# Patient Record
Sex: Female | Born: 1950 | Race: White | Hispanic: No | Smoking: Never smoker
Health system: Southern US, Community
[De-identification: ages and names within clinical notes are randomized; demographics above are authoritative.]

## PROBLEM LIST (undated history)

## (undated) DIAGNOSIS — M8580 Other specified disorders of bone density and structure, unspecified site: Secondary | ICD-10-CM

## (undated) DIAGNOSIS — I671 Cerebral aneurysm, nonruptured: Secondary | ICD-10-CM

## (undated) DIAGNOSIS — T7840XA Allergy, unspecified, initial encounter: Secondary | ICD-10-CM

## (undated) DIAGNOSIS — K219 Gastro-esophageal reflux disease without esophagitis: Secondary | ICD-10-CM

## (undated) HISTORY — DX: Allergy, unspecified, initial encounter: T78.40XA

## (undated) HISTORY — PX: TONSILLECTOMY: SUR1361

## (undated) HISTORY — DX: Other specified disorders of bone density and structure, unspecified site: M85.80

## (undated) HISTORY — DX: Cerebral aneurysm, nonruptured: I67.1

## (undated) HISTORY — DX: Gastro-esophageal reflux disease without esophagitis: K21.9

## (undated) HISTORY — PX: TUBAL LIGATION: SHX77

---

## 2003-12-04 ENCOUNTER — Ambulatory Visit: Payer: Self-pay | Admitting: Family Medicine

## 2003-12-15 ENCOUNTER — Ambulatory Visit: Payer: Self-pay | Admitting: Family Medicine

## 2004-12-06 ENCOUNTER — Ambulatory Visit: Payer: Self-pay | Admitting: Family Medicine

## 2005-03-11 ENCOUNTER — Ambulatory Visit: Payer: Self-pay | Admitting: Unknown Physician Specialty

## 2005-12-08 ENCOUNTER — Ambulatory Visit: Payer: Self-pay | Admitting: Family Medicine

## 2005-12-14 ENCOUNTER — Ambulatory Visit: Payer: Self-pay | Admitting: Family Medicine

## 2007-07-26 ENCOUNTER — Ambulatory Visit: Payer: Self-pay | Admitting: Family Medicine

## 2008-10-20 ENCOUNTER — Ambulatory Visit: Payer: Self-pay | Admitting: Family Medicine

## 2009-11-19 ENCOUNTER — Ambulatory Visit: Payer: Self-pay | Admitting: Family Medicine

## 2010-12-24 ENCOUNTER — Ambulatory Visit: Payer: Self-pay | Admitting: Family Medicine

## 2011-12-09 ENCOUNTER — Ambulatory Visit: Payer: Self-pay | Admitting: Family Medicine

## 2011-12-14 DIAGNOSIS — R0689 Other abnormalities of breathing: Secondary | ICD-10-CM | POA: Insufficient documentation

## 2011-12-14 DIAGNOSIS — R062 Wheezing: Secondary | ICD-10-CM | POA: Insufficient documentation

## 2012-01-10 ENCOUNTER — Ambulatory Visit: Payer: Self-pay | Admitting: Family Medicine

## 2012-05-30 ENCOUNTER — Ambulatory Visit: Payer: Self-pay | Admitting: Unknown Physician Specialty

## 2012-06-14 DIAGNOSIS — H93A9 Pulsatile tinnitus, unspecified ear: Secondary | ICD-10-CM | POA: Insufficient documentation

## 2012-06-14 DIAGNOSIS — I671 Cerebral aneurysm, nonruptured: Secondary | ICD-10-CM | POA: Insufficient documentation

## 2013-02-12 DIAGNOSIS — E7800 Pure hypercholesterolemia, unspecified: Secondary | ICD-10-CM | POA: Insufficient documentation

## 2013-02-14 ENCOUNTER — Ambulatory Visit: Payer: Self-pay | Admitting: Family Medicine

## 2013-05-06 ENCOUNTER — Ambulatory Visit: Payer: Self-pay | Admitting: Unknown Physician Specialty

## 2014-04-16 ENCOUNTER — Ambulatory Visit: Payer: Self-pay | Admitting: Obstetrics and Gynecology

## 2014-05-28 ENCOUNTER — Ambulatory Visit
Admit: 2014-05-28 | Disposition: A | Discharge status: Home / Self Care | Payer: Self-pay | Attending: Family Medicine | Admitting: Family Medicine

## 2014-07-10 ENCOUNTER — Other Ambulatory Visit: Payer: Self-pay | Admitting: Neurosurgery

## 2014-07-10 DIAGNOSIS — I7774 Dissection of vertebral artery: Secondary | ICD-10-CM

## 2014-07-11 ENCOUNTER — Other Ambulatory Visit: Payer: Self-pay | Admitting: Neurosurgery

## 2014-07-14 ENCOUNTER — Other Ambulatory Visit: Payer: Self-pay | Admitting: Neurosurgery

## 2014-07-14 DIAGNOSIS — I7774 Dissection of vertebral artery: Secondary | ICD-10-CM

## 2014-07-21 ENCOUNTER — Inpatient Hospital Stay: Admission: RE | Admit: 2014-07-21 | Payer: Self-pay | Source: Ambulatory Visit

## 2014-07-24 ENCOUNTER — Other Ambulatory Visit: Payer: Self-pay

## 2014-07-24 ENCOUNTER — Ambulatory Visit
Admission: RE | Admit: 2014-07-24 | Discharge: 2014-07-24 | Disposition: A | Discharge status: Home / Self Care | Payer: 59 | Source: Ambulatory Visit | Attending: Neurosurgery | Admitting: Neurosurgery

## 2014-07-24 DIAGNOSIS — I7774 Dissection of vertebral artery: Secondary | ICD-10-CM | POA: Diagnosis present

## 2014-07-24 DIAGNOSIS — I728 Aneurysm of other specified arteries: Secondary | ICD-10-CM | POA: Diagnosis not present

## 2014-07-24 DIAGNOSIS — I998 Other disorder of circulatory system: Secondary | ICD-10-CM | POA: Diagnosis not present

## 2014-07-24 MED ORDER — IOHEXOL 350 MG/ML SOLN
75.0000 mL | Freq: Once | INTRAVENOUS | Status: AC | PRN
  Administered 2014-07-24: 75 mL via INTRAVENOUS

## 2015-01-13 ENCOUNTER — Encounter: Payer: Self-pay | Admitting: Family Medicine

## 2015-01-13 ENCOUNTER — Ambulatory Visit (INDEPENDENT_AMBULATORY_CARE_PROVIDER_SITE_OTHER): Payer: 59 | Admitting: Family Medicine

## 2015-01-13 VITALS — BP 112/78 | HR 112 | Temp 98.9°F | Resp 16 | Ht 64.0 in | Wt 169.1 lb

## 2015-01-13 DIAGNOSIS — F339 Major depressive disorder, recurrent, unspecified: Secondary | ICD-10-CM | POA: Diagnosis not present

## 2015-01-13 DIAGNOSIS — K5900 Constipation, unspecified: Secondary | ICD-10-CM | POA: Insufficient documentation

## 2015-01-13 DIAGNOSIS — K219 Gastro-esophageal reflux disease without esophagitis: Secondary | ICD-10-CM | POA: Insufficient documentation

## 2015-01-13 DIAGNOSIS — T7840XA Allergy, unspecified, initial encounter: Secondary | ICD-10-CM | POA: Insufficient documentation

## 2015-01-13 MED ORDER — SERTRALINE HCL 25 MG PO TABS: 25.0000 mg | ORAL_TABLET | Freq: Every day | ORAL | Status: DC

## 2015-01-13 NOTE — Progress Notes (Signed)
Name: Laura Harper   MRN: [id number]    DOB: 08-08-1950   Date:01/13/2015       Progress Note  Subjective  Chief Complaint  Chief Complaint  Patient presents with  . Stress    patient stated that she has been crying a lot and haslost her temper at times.    HPI  Laura Harper is a 64 year old female here today with reports of mood changes. Onset just before Thanksgiving early November. Husband has cancer, he is stable, but condition is difficult as his memory is bad and hard time remembering things explained to him. 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 Daughter moved to [place] state with granddaughter in March. All these factors contributed to mood changes. Denies SI or HI.   Many years ago had depression, placed on Prozac dose unknown, did not help much, made her have bad short term memory. Did not know how she got to work, how she did her job. Very foggy memory while on medication.   Daughter and biological sister do well on Zoloft.   Past Medical History  Diagnosis Date  . Allergy   . GERD (gastroesophageal reflux disease)     Patient Active Problem List   Diagnosis Date Noted  . Allergic state 01/13/2015  . CN (constipation) 01/13/2015  . Acid reflux 01/13/2015  . Pure hypercholesterolemia 02/12/2013  . Aneurysm, cerebral, nonruptured 06/14/2012  . Tinnitus of vascular origin 06/14/2012  . Asthmatic breathing 12/14/2011    Social History  Substance Use Topics  . Smoking status: Never Smoker   . Smokeless tobacco: Not on file  . Alcohol Use: No     Current outpatient prescriptions:  .  aspirin 81 MG tablet, Take 81 mg by mouth daily., Disp: , Rfl:  .  omeprazole (PRILOSEC) 10 MG capsule, Take by mouth., Disp: , Rfl:   Past Surgical History  Procedure Laterality Date  . Tubal ligation    . Tonsillectomy Bilateral     Family History  Problem Relation Age of Onset  . Anxiety disorder Mother   . Mood Disorder Father   . Depression Sister   .  Depression Daughter     No Known Allergies   Review of Systems  CONSTITUTIONAL: No significant weight changes, fever, chills, weakness or fatigue.  CARDIOVASCULAR: No chest pain, chest pressure or chest discomfort. No palpitations or edema.  RESPIRATORY: No shortness of breath, cough or sputum.  NEUROLOGICAL: No headache, dizziness, syncope, paralysis, ataxia, numbness or tingling in the extremities. No memory changes. No change in bowel or bladder control.  PSYCHIATRIC: Yes change in mood. No change in sleep pattern.  ENDOCRINOLOGIC: No reports of sweating, cold or heat intolerance. No polyuria or polydipsia.   Depression screen PHQ 2/9 01/13/2015  Down, Depressed, Hopeless 3  PHQ - 2 Score 3  Altered sleeping 3  Tired, decreased energy 3  Change in appetite 0  Feeling bad or failure about yourself  1  Trouble concentrating 1  Moving slowly or fidgety/restless 0  Suicidal thoughts 0  PHQ-9 Score 11     Objective  BP 112/78 mmHg  Pulse 112  Temp(Src) 98.9 F (37.2 C) (Oral)  Resp 16  Ht 5\' 4"  (1.626 m)  Wt 169 lb 1.6 oz (76.703 kg)  BMI 29.01 kg/m2  SpO2 96% Body mass index is 29.01 kg/(m^2).  Physical Exam  Constitutional: Patient appears well-developed and well-nourished. In no distress.  Neck: Normal range of motion. Neck supple.  No JVD present. No thyromegaly present.  Cardiovascular: Normal rate, regular rhythm and normal heart sounds.  No murmur heard.  Pulmonary/Chest: Effort normal and breath sounds normal. No respiratory distress. Psychiatric: Patient has a sad mood and affect. Behavior is normal in office today. Judgment and thought content normal in office today.   Assessment & Plan   1. Major depressive disorder, recurrent, with melancholic features ([place]) Decided on Zoloft trial as other female family members have done well on this medication. The patient has been counseled on the proper use, side effects and potential interactions of the new  medication. Patient encouraged to review the side effects and safety profile pamphlet provided with the prescription from the pharmacy as well as request counseling from the pharmacy team as needed.   - sertraline (ZOLOFT) 25 MG tablet; Take 1 tablet (25 mg total) by mouth daily.  Dispense: 30 tablet; Refill: 1

## 2015-03-09 ENCOUNTER — Telehealth: Payer: Self-pay | Admitting: Family Medicine

## 2015-03-09 ENCOUNTER — Other Ambulatory Visit: Payer: Self-pay

## 2015-03-09 DIAGNOSIS — F339 Major depressive disorder, recurrent, unspecified: Secondary | ICD-10-CM

## 2015-03-09 NOTE — Telephone Encounter (Signed)
Pt has an appt 04/13/2015 and she is requesting a refill on Zoloft. Pt requesting enough to last until her appt. [facility] td.

## 2015-03-11 MED ORDER — SERTRALINE HCL 25 MG PO TABS: 25.0000 mg | ORAL_TABLET | Freq: Every day | ORAL | Status: DC

## 2015-03-17 ENCOUNTER — Other Ambulatory Visit: Payer: Self-pay

## 2015-03-17 DIAGNOSIS — F339 Major depressive disorder, recurrent, unspecified: Secondary | ICD-10-CM

## 2015-03-17 MED ORDER — SERTRALINE HCL 25 MG PO TABS: 25.0000 mg | ORAL_TABLET | Freq: Every day | ORAL | Status: DC

## 2015-03-23 ENCOUNTER — Encounter: Payer: Self-pay | Admitting: Anesthesiology

## 2015-03-23 ENCOUNTER — Ambulatory Visit
Admission: RE | Admit: 2015-03-23 | Discharge: 2015-03-23 | Disposition: A | Discharge status: Home / Self Care | Payer: 59 | Source: Ambulatory Visit | Attending: Unknown Physician Specialty | Admitting: Unknown Physician Specialty

## 2015-03-23 ENCOUNTER — Encounter
Admission: RE | Disposition: A | Discharge status: Home / Self Care | Payer: Self-pay | Source: Ambulatory Visit | Attending: Unknown Physician Specialty

## 2015-03-23 ENCOUNTER — Ambulatory Visit: Payer: 59 | Admitting: Certified Registered Nurse Anesthetist

## 2015-03-23 DIAGNOSIS — D122 Benign neoplasm of ascending colon: Secondary | ICD-10-CM | POA: Diagnosis not present

## 2015-03-23 DIAGNOSIS — Z1211 Encounter for screening for malignant neoplasm of colon: Secondary | ICD-10-CM | POA: Insufficient documentation

## 2015-03-23 DIAGNOSIS — Z7982 Long term (current) use of aspirin: Secondary | ICD-10-CM | POA: Insufficient documentation

## 2015-03-23 DIAGNOSIS — K621 Rectal polyp: Secondary | ICD-10-CM | POA: Insufficient documentation

## 2015-03-23 DIAGNOSIS — D125 Benign neoplasm of sigmoid colon: Secondary | ICD-10-CM | POA: Diagnosis not present

## 2015-03-23 DIAGNOSIS — K640 First degree hemorrhoids: Secondary | ICD-10-CM | POA: Insufficient documentation

## 2015-03-23 DIAGNOSIS — D123 Benign neoplasm of transverse colon: Secondary | ICD-10-CM | POA: Diagnosis not present

## 2015-03-23 DIAGNOSIS — D126 Benign neoplasm of colon, unspecified: Secondary | ICD-10-CM

## 2015-03-23 DIAGNOSIS — I739 Peripheral vascular disease, unspecified: Secondary | ICD-10-CM | POA: Diagnosis not present

## 2015-03-23 DIAGNOSIS — F329 Major depressive disorder, single episode, unspecified: Secondary | ICD-10-CM | POA: Insufficient documentation

## 2015-03-23 DIAGNOSIS — K219 Gastro-esophageal reflux disease without esophagitis: Secondary | ICD-10-CM | POA: Insufficient documentation

## 2015-03-23 DIAGNOSIS — Z79899 Other long term (current) drug therapy: Secondary | ICD-10-CM | POA: Diagnosis not present

## 2015-03-23 HISTORY — PX: COLONOSCOPY WITH PROPOFOL: SHX5780

## 2015-03-23 SURGERY — COLONOSCOPY WITH PROPOFOL
Anesthesia: General

## 2015-03-23 MED ORDER — SODIUM CHLORIDE 0.9 % IV SOLN
INTRAVENOUS | Status: DC
  Administered 2015-03-23: 08:00:00 via INTRAVENOUS

## 2015-03-23 MED ORDER — PROPOFOL 10 MG/ML IV BOLUS
INTRAVENOUS | Status: DC | PRN
  Administered 2015-03-23: 30 mg via INTRAVENOUS
  Administered 2015-03-23 (×2): 20 mg via INTRAVENOUS
  Administered 2015-03-23: 10 mg via INTRAVENOUS

## 2015-03-23 MED ORDER — LIDOCAINE HCL (CARDIAC) 20 MG/ML IV SOLN
INTRAVENOUS | Status: DC | PRN
  Administered 2015-03-23: 60 mg via INTRAVENOUS

## 2015-03-23 MED ORDER — SODIUM CHLORIDE 0.9 % IV SOLN: INTRAVENOUS | Status: DC

## 2015-03-23 MED ORDER — PROPOFOL 500 MG/50ML IV EMUL
INTRAVENOUS | Status: DC | PRN
  Administered 2015-03-23: 140 ug/kg/min via INTRAVENOUS

## 2015-03-23 MED ORDER — MIDAZOLAM HCL 2 MG/2ML IJ SOLN
INTRAMUSCULAR | Status: DC | PRN
  Administered 2015-03-23: 1 mg via INTRAVENOUS

## 2015-03-23 NOTE — Transfer of Care (Signed)
Immediate Anesthesia Transfer of Care Note  Patient: Laura Harper  Procedure(s) Performed: Procedure(s): COLONOSCOPY WITH PROPOFOL (N/A)  Patient Location: PACU  Anesthesia Type:General  Level of Consciousness: awake  Airway & Oxygen Therapy: Patient Spontanous Breathing and Patient connected to nasal cannula oxygen  Post-op Assessment: Report given to RN and Post -op Vital signs reviewed and stable  Post vital signs: Reviewed and stable  Last Vitals:  Filed Vitals:   03/23/15 0750  BP: 143/70  Pulse: 60  Temp: 36.7 C  Resp: 17    Complications: No apparent anesthesia complications

## 2015-03-23 NOTE — Anesthesia Postprocedure Evaluation (Signed)
Anesthesia Post Note  Patient: Laura Harper  Procedure(s) Performed: Procedure(s) (LRB): COLONOSCOPY WITH PROPOFOL (N/A)  Patient location during evaluation: Endoscopy Anesthesia Type: General Level of consciousness: awake and alert Pain management: pain level controlled Vital Signs Assessment: post-procedure vital signs reviewed and stable Respiratory status: spontaneous breathing, nonlabored ventilation, respiratory function stable and patient connected to nasal cannula oxygen Cardiovascular status: blood pressure returned to baseline and stable Postop Assessment: no signs of nausea or vomiting Anesthetic complications: no    Last Vitals:  Filed Vitals:   03/23/15 0911 03/23/15 0921  BP: 114/70 125/64  Pulse: 56 55  Temp:    Resp: 13 17    Last Pain: There were no vitals filed for this visit.               Precious Haws Helayne Metsker

## 2015-03-23 NOTE — Anesthesia Preprocedure Evaluation (Signed)
Anesthesia Evaluation  Patient identified by MRN, date of birth, ID band Patient awake    Reviewed: Allergy & Precautions, H&P , NPO status , Patient's Chart, lab work & pertinent test results  History of Anesthesia Complications Negative for: history of anesthetic complications  Airway Mallampati: III  TM Distance: >3 FB Neck ROM: full    Dental  (+) Poor Dentition   Pulmonary neg pulmonary ROS, neg shortness of breath,    Pulmonary exam normal breath sounds clear to auscultation       Cardiovascular Exercise Tolerance: Good (-) angina+ Peripheral Vascular Disease  (-) Past MI and (-) DOE negative cardio ROS Normal cardiovascular exam Rhythm:regular Rate:Normal     Neuro/Psych PSYCHIATRIC DISORDERS Depression negative neurological ROS     GI/Hepatic Neg liver ROS, GERD  Controlled,  Endo/Other  negative endocrine ROS  Renal/GU negative Renal ROS  negative genitourinary   Musculoskeletal   Abdominal   Peds  Hematology negative hematology ROS (+)   Anesthesia Other Findings Past Medical History:   Allergy                                                      GERD (gastroesophageal reflux disease)                       Brain aneurysm                                              Past Surgical History:   TUBAL LIGATION                                                TONSILLECTOMY                                   Bilateral             BMI    Body Mass Index   30.71 kg/m 2    Signs and symptoms suggestive of sleep apnea    Reproductive/Obstetrics negative OB ROS                             Anesthesia Physical Anesthesia Plan  ASA: III  Anesthesia Plan: General   Post-op Pain Management:    Induction:   Airway Management Planned:   Additional Equipment:   Intra-op Plan:   Post-operative Plan:   Informed Consent: I have reviewed the patients History and Physical, chart,  labs and discussed the procedure including the risks, benefits and alternatives for the proposed anesthesia with the patient or authorized representative who has indicated his/her understanding and acceptance.   Dental Advisory Given  Plan Discussed with: Anesthesiologist, CRNA and Surgeon  Anesthesia Plan Comments:         Anesthesia Quick Evaluation

## 2015-03-23 NOTE — Op Note (Signed)
[facility] Patient Name: Laura Harper Procedure Date: 03/23/2015 8:06 AM MRN: [id number] Account #: [id number] Date of Birth: 06-Mar-1950 Admit Type: Outpatient Age: 65 Room: [facility] ENDO ROOM 1 Gender: Female Note Status: Finalized Procedure:            Colonoscopy Indications:          Screening for colorectal malignant neoplasm Providers:            Manya Silvas, MD Referring MD:         Bobetta Lime (Referring MD) Medicines:            Propofol per Anesthesia Complications:        No immediate complications. Procedure:            Pre-Anesthesia Assessment:                       - After reviewing the risks and benefits, the patient                        was deemed in satisfactory condition to undergo the                        procedure.                       After obtaining informed consent, the colonoscope was                        passed under direct vision. Throughout the procedure,                        the patient's blood pressure, pulse, and oxygen                        saturations were monitored continuously. The                        Colonoscope was introduced through the anus and                        advanced to the the cecum, identified by appendiceal                        orifice and ileocecal valve. The colonoscopy was                        performed without difficulty. The patient tolerated the                        procedure well. The quality of the bowel preparation                        was excellent. Findings:      Three sessile polyps were found in the rectum, transverse colon and       ascending colon. The polyps were diminutive in size. These polyps were       removed with a jumbo cold forceps. Resection and retrieval were complete.      A small polyp was found in the sigmoid colon. The polyp was sessile. The       polyp was removed with a cold snare. Resection  and retrieval were       complete.  Internal hemorrhoids were found during endoscopy. The hemorrhoids were       small and Grade I (internal hemorrhoids that do not prolapse). Impression:           - Three diminutive polyps in the rectum, in the                        transverse colon and in the ascending colon, removed                        with a jumbo cold forceps. Resected and retrieved.                       - One small polyp in the sigmoid colon, removed with a                        cold snare. Resected and retrieved.                       - Internal hemorrhoids. Recommendation:       - Await pathology results. Manya Silvas, MD 03/23/2015 8:50:09 AM This report has been signed electronically. Number of Addenda: 0 Note Initiated On: 03/23/2015 8:06 AM Scope Withdrawal Time: 0 hours 11 minutes 25 seconds  Total Procedure Duration: 0 hours 24 minutes 8 seconds       [facility]

## 2015-03-23 NOTE — H&P (Signed)
   Primary Care Physician:  Bobetta Lime, MD Primary Gastroenterologist:  Dr. Vira Agar  Pre-Procedure History & Physical: HPI:  Laura Harper is a 65 y.o. female is here for an colonoscopy.   Past Medical History  Diagnosis Date  . Allergy   . GERD (gastroesophageal reflux disease)   . Brain aneurysm     Past Surgical History  Procedure Laterality Date  . Tubal ligation    . Tonsillectomy Bilateral     Prior to Admission medications   Medication Sig Start Date End Date Taking? Authorizing Provider  aspirin 81 MG tablet Take 81 mg by mouth daily.    Historical Provider, MD  omeprazole (PRILOSEC) 10 MG capsule Take by mouth. 03/24/10   Historical Provider, MD  sertraline (ZOLOFT) 25 MG tablet Take 1 tablet (25 mg total) by mouth daily. 03/17/15   Bobetta Lime, MD    Allergies as of 03/18/2015  . (No Known Allergies)    Family History  Problem Relation Age of Onset  . Anxiety disorder Mother   . Cancer Mother   . Aneurysm Mother     subarachnoid hemorrhage  . Mood Disorder Father   . Coronary artery disease Father   . Depression Sister   . Depression Daughter   . Heart attack Brother   . Cancer Maternal Aunt     breast    Social History   Social History  . Marital Status: Married    Spouse Name: N/A  . Number of Children: N/A  . Years of Education: N/A   Occupational History  . Not on file.   Social History Main Topics  . Smoking status: Never Smoker   . Smokeless tobacco: Not on file  . Alcohol Use: No  . Drug Use: No  . Sexual Activity: No   Other Topics Concern  . Not on file   Social History Narrative    Review of Systems: See HPI, otherwise negative ROS  Physical Exam: BP 143/70 mmHg  Pulse 60  Temp(Src) 98 F (36.7 C) (Tympanic)  Resp 17  Ht 5\' 2"  (1.575 m)  Wt 76.204 kg (168 lb)  BMI 30.72 kg/m2  SpO2 100% General:   Alert,  pleasant and cooperative in NAD Head:  Normocephalic and atraumatic. Neck:  Supple; no masses or  thyromegaly. Lungs:  Clear throughout to auscultation.    Heart:  Regular rate and rhythm. Abdomen:  Soft, nontender and nondistended. Normal bowel sounds, without guarding, and without rebound.   Neurologic:  Alert and  oriented x4;  grossly normal neurologically.  Impression/Plan: Laura Harper is here for an colonoscopy to be performed for screening  Risks, benefits, limitations, and alternatives regarding  colonoscopy have been reviewed with the patient.  Questions have been answered.  All parties agreeable.   Gaylyn Cheers, MD  03/23/2015, 8:19 AM

## 2015-03-23 NOTE — Anesthesia Procedure Notes (Signed)
Date/Time: 03/23/2015 8:19 AM Performed by: Johnna Acosta Pre-anesthesia Checklist: Patient identified, Emergency Drugs available, Suction available, Patient being monitored and Timeout performed Patient Re-evaluated:Patient Re-evaluated prior to inductionOxygen Delivery Method: Nasal cannula

## 2015-03-24 ENCOUNTER — Encounter: Payer: Self-pay | Admitting: Unknown Physician Specialty

## 2015-03-24 DIAGNOSIS — D126 Benign neoplasm of colon, unspecified: Secondary | ICD-10-CM

## 2015-03-24 LAB — SURGICAL PATHOLOGY

## 2015-04-02 ENCOUNTER — Encounter: Payer: Self-pay | Admitting: Obstetrics and Gynecology

## 2015-04-13 ENCOUNTER — Ambulatory Visit (INDEPENDENT_AMBULATORY_CARE_PROVIDER_SITE_OTHER): Payer: 59 | Admitting: Family Medicine

## 2015-04-13 ENCOUNTER — Encounter: Payer: Self-pay | Admitting: Family Medicine

## 2015-04-13 VITALS — BP 122/68 | HR 89 | Temp 98.6°F | Resp 16 | Ht 62.0 in | Wt 169.0 lb

## 2015-04-13 DIAGNOSIS — L603 Nail dystrophy: Secondary | ICD-10-CM | POA: Diagnosis not present

## 2015-04-13 DIAGNOSIS — Z124 Encounter for screening for malignant neoplasm of cervix: Secondary | ICD-10-CM | POA: Diagnosis not present

## 2015-04-13 DIAGNOSIS — Z1231 Encounter for screening mammogram for malignant neoplasm of breast: Secondary | ICD-10-CM | POA: Diagnosis not present

## 2015-04-13 DIAGNOSIS — K219 Gastro-esophageal reflux disease without esophagitis: Secondary | ICD-10-CM | POA: Diagnosis not present

## 2015-04-13 DIAGNOSIS — F3342 Major depressive disorder, recurrent, in full remission: Secondary | ICD-10-CM | POA: Diagnosis not present

## 2015-04-13 DIAGNOSIS — Z113 Encounter for screening for infections with a predominantly sexual mode of transmission: Secondary | ICD-10-CM | POA: Insufficient documentation

## 2015-04-13 DIAGNOSIS — Z0000 Encounter for general adult medical examination without abnormal findings: Secondary | ICD-10-CM

## 2015-04-13 DIAGNOSIS — E7800 Pure hypercholesterolemia, unspecified: Secondary | ICD-10-CM

## 2015-04-13 MED ORDER — SERTRALINE HCL 25 MG PO TABS: 25.0000 mg | ORAL_TABLET | Freq: Every day | ORAL | Status: DC

## 2015-04-13 NOTE — Progress Notes (Signed)
Name: Laura Harper   MRN: [id number]    DOB: 08-20-1950   Date:04/13/2015       Progress Note  Subjective  Chief Complaint  Chief Complaint  Patient presents with  . Annual Exam    HPI  Patient is here today for a Complete Female Physical Exam:  The patient has has no unusual complaints. Overall feels healthy. Diet is well balanced. In general does not exercise regularly. Sees dentist regularly and addresses vision concerns with ophthalmologist if applicable. In regards to sexual activity the patient is not currently sexually active. Currently is not concerned about exposure to any STDs.   Having some brittle nails. Started hair skin nails OTC Vitamin. Requesting MRI of brain to surveillance known aneurysm. No new symptoms, no worsening headaches, nausea or vomiting or changes to vision.   GERD stable with OTC Nexium use. Mood disorder stable with Zoloft 25 mg one a day.  Past Medical History  Diagnosis Date  . Allergy   . GERD (gastroesophageal reflux disease)   . Brain aneurysm     Past Surgical History  Procedure Laterality Date  . Tubal ligation    . Tonsillectomy Bilateral   . Colonoscopy with propofol N/A 03/23/2015    Procedure: COLONOSCOPY WITH PROPOFOL;  Surgeon: Manya Silvas, MD;  Location: [facility] ENDOSCOPY;  Service: Endoscopy;  Laterality: N/A;    Family History  Problem Relation Age of Onset  . Anxiety disorder Mother   . Cancer Mother   . Aneurysm Mother     subarachnoid hemorrhage  . Mood Disorder Father   . Coronary artery disease Father   . Depression Sister   . Depression Daughter   . Heart attack Brother   . Cancer Maternal Aunt     breast    Social History   Social History  . Marital Status: Married    Spouse Name: N/A  . Number of Children: N/A  . Years of Education: N/A   Occupational History  . Not on file.   Social History Main Topics  . Smoking status: Never Smoker   . Smokeless tobacco: Not on file  . Alcohol Use: No  .  Drug Use: No  . Sexual Activity: No   Other Topics Concern  . Not on file   Social History Narrative     Current outpatient prescriptions:  .  aspirin 81 MG tablet, Take 81 mg by mouth daily., Disp: , Rfl:  .  omeprazole (PRILOSEC) 10 MG capsule, Take by mouth., Disp: , Rfl:  .  sertraline (ZOLOFT) 25 MG tablet, Take 1 tablet (25 mg total) by mouth daily., Disp: 90 tablet, Rfl: 0  No Known Allergies  ROS  CONSTITUTIONAL: No significant weight changes, fever, chills, weakness or fatigue.  HEENT:  - Eyes: No visual changes.  - Ears: No auditory changes. No pain.  - Nose: No sneezing, congestion, runny nose. - Throat: No sore throat. No changes in swallowing. SKIN: No rash or itching.  CARDIOVASCULAR: No chest pain, chest pressure or chest discomfort. No palpitations or edema.  RESPIRATORY: No shortness of breath, cough or sputum.  GASTROINTESTINAL: No anorexia, nausea, vomiting. No changes in bowel habits. No abdominal pain or blood.  GENITOURINARY: No dysuria. No frequency. No discharge.  NEUROLOGICAL: No headache, dizziness, syncope, paralysis, ataxia, numbness or tingling in the extremities. No memory changes. No change in bowel or bladder control.  MUSCULOSKELETAL: No joint pain. No muscle pain. HEMATOLOGIC: No anemia, bleeding or bruising.  LYMPHATICS: No enlarged lymph  nodes.  PSYCHIATRIC: No change in mood. No change in sleep pattern.  ENDOCRINOLOGIC: No reports of sweating, cold or heat intolerance. No polyuria or polydipsia.   Objective  Filed Vitals:   04/13/15 1346  BP: 122/68  Pulse: 89  Temp: 98.6 F (37 C)  TempSrc: Oral  Resp: 16  Height: 5\' 2"  (1.575 m)  Weight: 169 lb (76.658 kg)  SpO2: 96%   Body mass index is 30.9 kg/(m^2).  No exam data present  Recent Results (from the past 2160 hour(s))  Surgical pathology     Status: None   Collection Time: 03/23/15  8:28 AM  Result Value Ref Range   SURGICAL PATHOLOGY      Surgical Pathology CASE:  [id number] PATIENT: Hansini Harris Surgical Pathology Report     SPECIMEN SUBMITTED: A. Colon polyp, ascending, cbx B. Colon polyp, transverse, cbx C. Colon polyp, sigmoid, cold snare D. Rectum polyp, cbx  CLINICAL HISTORY: None provided  PRE-OPERATIVE DIAGNOSIS: Screen  POST-OPERATIVE DIAGNOSIS: Polyps, internal hemorrhoids     DIAGNOSIS: A. COLON POLYP, ASCENDING; COLD BIOPSY: - TUBULAR ADENOMA. - NEGATIVE FOR HIGH-GRADE DYSPLASIA AND MALIGNANCY.  B. COLON POLYP, TRANSVERSE; COLD BIOPSY: - TUBULAR ADENOMA. - NEGATIVE FOR HIGH-GRADE DYSPLASIA AND MALIGNANCY.  C. COLON POLYP, SIGMOID; COLD SNARE: - TUBULAR ADENOMA. - NEGATIVE FOR HIGH-GRADE DYSPLASIA AND MALIGNANCY.  D. RECTUM POLYP; COLD BIOPSY: - HYPERPLASTIC POLYP. - NEGATIVE FOR DYSPLASIA AND MALIGNANCY.   GROSS DESCRIPTION:  A. Labeled: C BX polyp ascending colon  Tissue fragment(s): 4  Size: 0.2-0.3 cm  Description: pink-tan  Entirely submitted in one  cassette(s).   B. Labeled: C BX polyp transverse polyp  Tissue fragment(s): 1  Size: 0.3 cm  Description: pink  Entirely submitted in one cassette(s).   C. Labeled: cold snare polyp sigmoid colon  Tissue fragment(s): 1  Size: 1.1 x 0.3 x 0.1 cm  Description: pink-tan polypoid fragment, inked blue at the base, bisected  Entirely submitted in one cassette(s).   D. Labeled: C BX polyp rectum  Tissue fragment(s): 1  Size: 0.25 cm  Description: pink  Entirely submitted in one cassette(s).    Final Diagnosis performed by Quay Burow, MD.  Electronically signed 03/24/2015 3:13:23PM    The electronic signature indicates that the named Attending Pathologist has evaluated the specimen  Technical component performed at [facility], [address] [place], [place] [address] Lab: [phone] Dir: Darrick Penna. Evette Doffing, MD  Professional component performed at [facility], [facility], [place],  [place], [place] [address] Lab: [place] Dir: [place] Reuel Derby, MD      Physical Exam  Constitutional: Patient appears well-developed and well-nourished. In no distress.  HEENT:  - Head: Normocephalic and atraumatic.  - Ears: Bilateral TMs gray, no erythema or effusion - Nose: Nasal mucosa moist - Mouth/Throat: Oropharynx is clear and moist. No tonsillar hypertrophy or erythema. No post nasal drainage.  - Eyes: Conjunctivae clear, EOM movements normal. PERRLA. No scleral icterus.  Neck: Normal range of motion. Neck supple. No JVD present. No thyromegaly present.  Cardiovascular: Normal rate, regular rhythm and normal heart sounds.  No murmur heard.  Pulmonary/Chest: Effort normal and breath sounds normal. No respiratory distress. Abdominal: Soft. Bowel sounds are normal, no distension. There is no tenderness. no masses BREAST: Bilateral breast exam normal with no masses, skin changes or nipple discharge FEMALE GENITALIA:  External genitalia normal External urethra normal Vaginal vault normal without discharge or lesions Cervix normal without discharge or lesions Bimanual exam normal without masses RECTAL:  no rectal masses or hemorrhoids Musculoskeletal: Normal range of motion bilateral UE and LE, no joint effusions. Peripheral vascular: Bilateral LE no edema. Neurological: CN II-XII grossly intact with no focal deficits. Alert and oriented to person, place, and time. Coordination, balance, strength, speech and gait are normal.  Skin: Skin is warm and dry. No rash noted. No erythema.  Psychiatric: Patient has a normal mood and affect. Behavior is normal in office today. Judgment and thought content normal in office today.   Assessment & Plan   1. Annual physical exam Discussed in detail all recommended preventative measures appropriate for age and gender now and in the future.  - CBC with Differential/Platelet - Comprehensive metabolic panel  2. Encounter for screening  mammogram for malignant neoplasm of breast  - MM Digital Screening; Future  3. Pure hypercholesterolemia  - Lipid panel  4. GERD without esophagitis Use otc ppi as needed, if possible cut back to h2 blocker.   5. Major depressive disorder, recurrent, in full remission with melancholic features ([place]) Stable.  - sertraline (ZOLOFT) 25 MG tablet; Take 1 tablet (25 mg total) by mouth daily.  Dispense: 90 tablet; Refill: 1  6. Brittle nails Will check thyroid and vit D levels.  - TSH - VITAMIN D 25 Hydroxy (Vit-D Deficiency, Fractures)  7. Screening for STD (sexually transmitted disease)  - HIV antibody - Hepatitis C antibody  8. Encounter for screening for malignant neoplasm of cervix  - Pap IG and HPV (high risk) DNA detection   Patient has been counseled that due to this being my last week at this practice I do not recommend any new orders be placed. If patient has insisted on any such orders they have acknowledged it is their sole responsibility alone to follow up on their results and to follow up with another provider in person to discuss results, interpretation and further care. They voice agreement to what has been discussed today.  She requested MRI of head to monitor brain aneurysm however as I will not be able to follow up on the results of this test I have recommended that she follows up with new provider to discuss this.

## 2015-04-16 LAB — LIPID PANEL
CHOL/HDL RATIO: 4.5 ratio — AB (ref 0.0–4.4)
Cholesterol, Total: 257 mg/dL — ABNORMAL HIGH (ref 100–199)
HDL: 57 mg/dL (ref >39)
LDL Calculated: 166 mg/dL — ABNORMAL HIGH (ref 0–99)
TRIGLYCERIDES: 171 mg/dL — AB (ref 0–149)
VLDL CHOLESTEROL CAL: 34 mg/dL (ref 5–40)

## 2015-04-16 LAB — CBC WITH DIFFERENTIAL/PLATELET
Basophils Absolute: 0 10*3/uL (ref 0.0–0.2)
Basos: 1 %
EOS (ABSOLUTE): 0.1 10*3/uL (ref 0.0–0.4)
EOS: 2 %
HEMATOCRIT: 35.1 % (ref 34.0–46.6)
HEMOGLOBIN: 12 g/dL (ref 11.1–15.9)
Immature Grans (Abs): 0 10*3/uL (ref 0.0–0.1)
Immature Granulocytes: 0 %
LYMPHS ABS: 2.1 10*3/uL (ref 0.7–3.1)
Lymphs: 38 %
MCH: 31.3 pg (ref 26.6–33.0)
MCHC: 34.2 g/dL (ref 31.5–35.7)
MCV: 92 fL (ref 79–97)
MONOCYTES: 6 %
MONOS ABS: 0.4 10*3/uL (ref 0.1–0.9)
NEUTROS ABS: 2.9 10*3/uL (ref 1.4–7.0)
Neutrophils: 53 %
Platelets: 298 10*3/uL (ref 150–379)
RBC: 3.83 x10E6/uL (ref 3.77–5.28)
RDW: 14.1 % (ref 12.3–15.4)
WBC: 5.5 10*3/uL (ref 3.4–10.8)

## 2015-04-16 LAB — COMPREHENSIVE METABOLIC PANEL WITH GFR
ALT: 27 [IU]/L (ref 0–32)
AST: 28 [IU]/L (ref 0–40)
Albumin/Globulin Ratio: 2 (ref 1.2–2.2)
Albumin: 4.3 g/dL (ref 3.6–4.8)
Alkaline Phosphatase: 98 [IU]/L (ref 39–117)
BUN/Creatinine Ratio: 27 — ABNORMAL HIGH (ref 11–26)
BUN: 18 mg/dL (ref 8–27)
Bilirubin Total: 0.2 mg/dL (ref 0.0–1.2)
CO2: 25 mmol/L (ref 18–29)
Calcium: 9.3 mg/dL (ref 8.7–10.3)
Chloride: 103 mmol/L (ref 96–106)
Creatinine, Ser: 0.67 mg/dL (ref 0.57–1.00)
GFR calc Af Amer: 109 mL/min/{1.73_m2}
GFR calc non Af Amer: 95 mL/min/{1.73_m2}
Globulin, Total: 2.2 g/dL (ref 1.5–4.5)
Glucose: 94 mg/dL (ref 65–99)
Potassium: 4.6 mmol/L (ref 3.5–5.2)
Sodium: 144 mmol/L (ref 134–144)
Total Protein: 6.5 g/dL (ref 6.0–8.5)

## 2015-04-16 LAB — TSH: TSH: 2.15 u[IU]/mL (ref 0.450–4.500)

## 2015-04-16 LAB — VITAMIN D 25 HYDROXY (VIT D DEFICIENCY, FRACTURES): Vit D, 25-Hydroxy: 36.1 ng/mL (ref 30.0–100.0)

## 2015-04-16 LAB — HEPATITIS C ANTIBODY

## 2015-04-16 LAB — HIV ANTIBODY (ROUTINE TESTING W REFLEX): HIV SCREEN 4TH GENERATION: NONREACTIVE

## 2015-04-17 LAB — PAP IG AND HPV HIGH-RISK
HPV, HIGH-RISK: NEGATIVE
PAP Smear Comment: 0

## 2015-04-20 MED ORDER — PRAVASTATIN SODIUM 20 MG PO TABS: 20.0000 mg | ORAL_TABLET | Freq: Every day | ORAL | Status: DC

## 2015-04-20 NOTE — Addendum Note (Signed)
Addended by: Lolita Rieger D on: 04/20/2015 11:13 AM   Modules accepted: Orders

## 2015-06-02 ENCOUNTER — Ambulatory Visit
Admission: RE | Admit: 2015-06-02 | Discharge: 2015-06-02 | Disposition: A | Discharge status: Home / Self Care | Payer: 59 | Source: Ambulatory Visit | Attending: Family Medicine | Admitting: Family Medicine

## 2015-06-02 DIAGNOSIS — Z1231 Encounter for screening mammogram for malignant neoplasm of breast: Secondary | ICD-10-CM | POA: Insufficient documentation

## 2015-06-08 ENCOUNTER — Other Ambulatory Visit: Payer: Self-pay | Admitting: Family Medicine

## 2015-06-08 DIAGNOSIS — N63 Unspecified lump in unspecified breast: Secondary | ICD-10-CM

## 2015-06-16 ENCOUNTER — Ambulatory Visit
Admission: RE | Admit: 2015-06-16 | Discharge: 2015-06-16 | Disposition: A | Discharge status: Home / Self Care | Payer: 59 | Source: Ambulatory Visit | Attending: Cardiology | Admitting: Cardiology

## 2015-06-16 DIAGNOSIS — N63 Unspecified lump in unspecified breast: Secondary | ICD-10-CM

## 2015-06-16 DIAGNOSIS — N6489 Other specified disorders of breast: Secondary | ICD-10-CM | POA: Diagnosis not present

## 2015-10-01 ENCOUNTER — Other Ambulatory Visit: Payer: Self-pay

## 2015-10-01 DIAGNOSIS — Z5181 Encounter for therapeutic drug level monitoring: Secondary | ICD-10-CM | POA: Insufficient documentation

## 2015-10-01 DIAGNOSIS — E785 Hyperlipidemia, unspecified: Secondary | ICD-10-CM

## 2015-10-01 NOTE — Assessment & Plan Note (Signed)
Check lipids on statin

## 2015-10-01 NOTE — Assessment & Plan Note (Signed)
Check sgpt on statin 

## 2015-10-01 NOTE — Telephone Encounter (Signed)
I see that patient was started on pravastatin in mid March, but I'd like to recheck her labs on this dose to see if we're at the right strength or if it needs to be adjusted before we send in 90 day supply I also need to see what her liver is doing on this medicine Please ask her to come tomorrow for fasting labs and I'll see on Saturday if her dose can continue at that strength or if it needs to be higher; thank you

## 2015-10-02 NOTE — Telephone Encounter (Signed)
.  left voicemail.

## 2015-10-15 ENCOUNTER — Ambulatory Visit (INDEPENDENT_AMBULATORY_CARE_PROVIDER_SITE_OTHER): Payer: 59 | Admitting: Family Medicine

## 2015-10-15 ENCOUNTER — Encounter: Payer: Self-pay | Admitting: Family Medicine

## 2015-10-15 DIAGNOSIS — Z5181 Encounter for therapeutic drug level monitoring: Secondary | ICD-10-CM | POA: Diagnosis not present

## 2015-10-15 DIAGNOSIS — E785 Hyperlipidemia, unspecified: Secondary | ICD-10-CM

## 2015-10-15 DIAGNOSIS — F3342 Major depressive disorder, recurrent, in full remission: Secondary | ICD-10-CM

## 2015-10-15 DIAGNOSIS — E669 Obesity, unspecified: Secondary | ICD-10-CM | POA: Diagnosis not present

## 2015-10-15 MED ORDER — SERTRALINE HCL 25 MG PO TABS: 25.0000 mg | ORAL_TABLET | Freq: Every day | ORAL | 1 refills | Status: DC

## 2015-10-15 MED ORDER — PRAVASTATIN SODIUM 20 MG PO TABS: 20.0000 mg | ORAL_TABLET | Freq: Every day | ORAL | 0 refills | Status: DC

## 2015-10-15 NOTE — Patient Instructions (Signed)
Try to limit saturated fats in your diet (bologna, hot dogs, barbeque, cheeseburgers, hamburgers, steak, bacon, sausage, cheese, etc.) and get more fresh fruits, vegetables, and whole grains  Check fasting labs this week or next  Check out the information at familydoctor.org entitled "Nutrition for Weight Loss: What You Need to Know about Fad Diets" Try to lose between 1-2 pounds per week by taking in fewer calories and burning off more calories You can succeed by limiting portions, limiting foods dense in calories and fat, becoming more active, and drinking 8 glasses of water a day (64 ounces) Don't skip meals, especially breakfast, as skipping meals may alter your metabolism Do not use over-the-counter weight loss pills or gimmicks that claim rapid weight loss A healthy BMI (or body mass index) is between 18.5 and 24.9 You can calculate your ideal BMI at the [place] website [employer]

## 2015-10-15 NOTE — Progress Notes (Signed)
BP 110/78   Pulse 87   Temp 98.1 F (36.7 C) (Oral)   Resp 14   Wt 167 lb 12.8 oz (76.1 kg)   SpO2 96%   BMI 30.69 kg/m    Subjective:    Patient ID: Laura Harper, female    DOB: [birth year], 65 y.o.   MRN: [id number]  HPI: Laura Harper is a 65 y.o. female  Chief Complaint  Patient presents with  . Follow-up    and work form   Patient is here for follow-up High cholesterol Runs in the family, sister and mother and aunt; aunt's is really high Reviewed lipid panel from March Total 257, TG 171, HDL 57, LDL 166 Not fasting today; she did not get out message from August Has not done anything for her diet  She is a stress eater; her husband has cancer; prostate cancer and it has spread; when he wants to eat, they eat; he is better now and she knows she has to do better; she got biometric screening and total chol was 185  Depression screen [facility] 2/9 10/15/2015 04/13/2015 01/13/2015  Decreased Interest 0 0 -  Down, Depressed, Hopeless 0 1 3  PHQ - 2 Score 0 1 3  Altered sleeping - - 3  Tired, decreased energy - - 3  Change in appetite - - 0  Feeling bad or failure about yourself  - - 1  Trouble concentrating - - 1  Moving slowly or fidgety/restless - - 0  Suicidal thoughts - - 0  PHQ-9 Score - - 11   Relevant past medical, surgical, family and social history reviewed Past Medical History:  Diagnosis Date  . Allergy   . Brain aneurysm   . GERD (gastroesophageal reflux disease)    Past Surgical History:  Procedure Laterality Date  . COLONOSCOPY WITH PROPOFOL N/A 03/23/2015   Procedure: COLONOSCOPY WITH PROPOFOL;  Surgeon: Manya Silvas, MD;  Location: [facility] ENDOSCOPY;  Service: Endoscopy;  Laterality: N/A;  . TONSILLECTOMY Bilateral   . TUBAL LIGATION     Family History  Problem Relation Age of Onset  . Anxiety disorder Mother   . Cancer Mother   . Aneurysm Mother     subarachnoid hemorrhage  . Mood Disorder Father   . Coronary artery disease Father   .  Depression Sister   . Depression Daughter   . Heart attack Brother   . Cancer Maternal Aunt     breast  . Breast cancer Maternal Aunt 57   Social History  Substance Use Topics  . Smoking status: Never Smoker  . Smokeless tobacco: Not on file  . Alcohol use No   Interim medical history since last visit reviewed. Allergies and medications reviewed  Review of Systems Per HPI unless specifically indicated above     Objective:    BP 110/78   Pulse 87   Temp 98.1 F (36.7 C) (Oral)   Resp 14   Wt 167 lb 12.8 oz (76.1 kg)   SpO2 96%   BMI 30.69 kg/m   Wt Readings from Last 3 Encounters:  10/15/15 167 lb 12.8 oz (76.1 kg)  04/13/15 169 lb (76.7 kg)  03/23/15 168 lb (76.2 kg)    Physical Exam  Constitutional: She appears well-developed and well-nourished. No distress.  obese  HENT:  Head: Normocephalic and atraumatic.  Eyes: EOM are normal. No scleral icterus.  Neck: No thyromegaly present.  Cardiovascular: Normal rate, regular rhythm and normal heart sounds.   No  murmur heard. Pulmonary/Chest: Effort normal and breath sounds normal. No respiratory distress. She has no wheezes.  Abdominal: Soft. Bowel sounds are normal. She exhibits no distension.  Musculoskeletal: Normal range of motion. She exhibits no edema.  Neurological: She is alert. She exhibits normal muscle tone.  Skin: Skin is warm and dry. She is not diaphoretic. No pallor.  Psychiatric: She has a normal mood and affect. Her behavior is normal. Judgment and thought content normal.   Results for orders placed or performed in visit on 04/13/15  CBC with Differential/Platelet  Result Value Ref Range   WBC 5.5 3.4 - 10.8 x10E3/uL   RBC 3.83 3.77 - 5.28 x10E6/uL   Hemoglobin 12.0 11.1 - 15.9 g/dL   Hematocrit 35.1 34.0 - 46.6 %   MCV 92 79 - 97 fL   MCH 31.3 26.6 - 33.0 pg   MCHC 34.2 31.5 - 35.7 g/dL   RDW 14.1 12.3 - 15.4 %   Platelets 298 150 - 379 x10E3/uL   Neutrophils 53 %   Lymphs 38 %   Monocytes  6 %   Eos 2 %   Basos 1 %   Neutrophils Absolute 2.9 1.4 - 7.0 x10E3/uL   Lymphocytes Absolute 2.1 0.7 - 3.1 x10E3/uL   Monocytes Absolute 0.4 0.1 - 0.9 x10E3/uL   EOS (ABSOLUTE) 0.1 0.0 - 0.4 x10E3/uL   Basophils Absolute 0.0 0.0 - 0.2 x10E3/uL   Immature Granulocytes 0 %   Immature Grans (Abs) 0.0 0.0 - 0.1 x10E3/uL  Comprehensive metabolic panel  Result Value Ref Range   Glucose 94 65 - 99 mg/dL   BUN 18 8 - 27 mg/dL   Creatinine, Ser 0.67 0.57 - 1.00 mg/dL   GFR calc non Af Amer 95 >59 mL/min/1.73   GFR calc Af Amer 109 >59 mL/min/1.73   BUN/Creatinine Ratio 27 (H) 11 - 26   Sodium 144 134 - 144 mmol/L   Potassium 4.6 3.5 - 5.2 mmol/L   Chloride 103 96 - 106 mmol/L   CO2 25 18 - 29 mmol/L   Calcium 9.3 8.7 - 10.3 mg/dL   Total Protein 6.5 6.0 - 8.5 g/dL   Albumin 4.3 3.6 - 4.8 g/dL   Globulin, Total 2.2 1.5 - 4.5 g/dL   Albumin/Globulin Ratio 2.0 1.2 - 2.2   Bilirubin Total 0.2 0.0 - 1.2 mg/dL   Alkaline Phosphatase 98 39 - 117 IU/L   AST 28 0 - 40 IU/L   ALT 27 0 - 32 IU/L  Lipid panel  Result Value Ref Range   Cholesterol, Total 257 (H) 100 - 199 mg/dL   Triglycerides 171 (H) 0 - 149 mg/dL   HDL 57 >39 mg/dL   VLDL Cholesterol Cal 34 5 - 40 mg/dL   LDL Calculated 166 (H) 0 - 99 mg/dL   Chol/HDL Ratio 4.5 (H) 0.0 - 4.4 ratio units  TSH  Result Value Ref Range   TSH 2.150 0.450 - 4.500 uIU/mL  VITAMIN D 25 Hydroxy (Vit-D Deficiency, Fractures)  Result Value Ref Range   Vit D, 25-Hydroxy 36.1 30.0 - 100.0 ng/mL  HIV antibody  Result Value Ref Range   HIV Screen 4th Generation wRfx Non Reactive Non Reactive  Hepatitis C antibody  Result Value Ref Range   Hep C Virus Ab <0.1 0.0 - 0.9 s/co ratio  Pap IG and HPV (high risk) DNA detection  Result Value Ref Range   DIAGNOSIS: Comment    Specimen adequacy: Comment    CLINICIAN PROVIDED ICD10:  Comment    Performed by: Comment    QC reviewed by: Comment    PAP SMEAR COMMENT .    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     Follow up plan: Return in about 6 weeks (around 11/26/2015) for weight management.  An after-visit summary was printed and given to the patient at [place].  Please see the patient instructions which may contain other information and recommendations beyond what is mentioned above in the assessment and plan.  Meds ordered this encounter  Medications  . sertraline (ZOLOFT) 25 MG tablet    Sig: Take 1 tablet (25 mg total) by mouth daily.    Dispense:  90 tablet    Refill:  1    For future fill  . pravastatin (PRAVACHOL) 20 MG tablet    Sig: Take 1 tablet (20 mg total) by mouth at bedtime.    Dispense:  90 tablet    Refill:  0    Orders Placed This Encounter  Procedures  . Lipid panel  . COMPLETE METABOLIC PANEL WITH GFR

## 2015-10-19 ENCOUNTER — Other Ambulatory Visit: Payer: Self-pay | Admitting: Family Medicine

## 2015-10-19 ENCOUNTER — Other Ambulatory Visit: Payer: Self-pay

## 2015-10-19 DIAGNOSIS — Z5181 Encounter for therapeutic drug level monitoring: Secondary | ICD-10-CM

## 2015-10-19 DIAGNOSIS — E785 Hyperlipidemia, unspecified: Secondary | ICD-10-CM

## 2015-10-20 DIAGNOSIS — E669 Obesity, unspecified: Secondary | ICD-10-CM | POA: Insufficient documentation

## 2015-10-20 LAB — LIPID PANEL WITH LDL/HDL RATIO
CHOLESTEROL TOTAL: 192 mg/dL (ref 100–199)
HDL: 64 mg/dL (ref >39)
LDL Calculated: 101 mg/dL — ABNORMAL HIGH (ref 0–99)
LDl/HDL Ratio: 1.6 ratio units (ref 0.0–3.2)
TRIGLYCERIDES: 137 mg/dL (ref 0–149)
VLDL Cholesterol Cal: 27 mg/dL (ref 5–40)

## 2015-10-20 LAB — COMPREHENSIVE METABOLIC PANEL
A/G RATIO: 1.8 (ref 1.2–2.2)
ALK PHOS: 115 IU/L (ref 39–117)
ALT: 19 IU/L (ref 0–32)
AST: 20 IU/L (ref 0–40)
Albumin: 4.5 g/dL (ref 3.6–4.8)
BUN/Creatinine Ratio: 16 (ref 12–28)
BUN: 13 mg/dL (ref 8–27)
Bilirubin Total: 0.2 mg/dL (ref 0.0–1.2)
CO2: 24 mmol/L (ref 18–29)
CREATININE: 0.79 mg/dL (ref 0.57–1.00)
Calcium: 9.7 mg/dL (ref 8.7–10.3)
Chloride: 105 mmol/L (ref 96–106)
GFR calc Af Amer: 93 mL/min/{1.73_m2} (ref >59)
GFR calc non Af Amer: 80 mL/min/{1.73_m2} (ref >59)
GLOBULIN, TOTAL: 2.5 g/dL (ref 1.5–4.5)
Glucose: 110 mg/dL — ABNORMAL HIGH (ref 65–99)
POTASSIUM: 5.2 mmol/L (ref 3.5–5.2)
SODIUM: 144 mmol/L (ref 134–144)
Total Protein: 7 g/dL (ref 6.0–8.5)

## 2015-10-20 NOTE — Assessment & Plan Note (Signed)
Monitor sgpt on statin 

## 2015-10-20 NOTE — Assessment & Plan Note (Signed)
Work on weight loss, reduced intake, more movement, don't skip meals; see AVS

## 2015-10-20 NOTE — Assessment & Plan Note (Signed)
Continue sertraline; 25 mg daily; call if any issues in between appointments

## 2015-10-20 NOTE — Assessment & Plan Note (Signed)
Reviewed last lipids; encouraged diet low in saturated fats; statin; monitor lipids every 6 weeks after dose change, every 6-12 months when stable

## 2015-10-29 ENCOUNTER — Ambulatory Visit: Payer: 59 | Admitting: Family Medicine

## 2015-11-26 ENCOUNTER — Encounter: Payer: Self-pay | Admitting: Family Medicine

## 2015-11-26 ENCOUNTER — Ambulatory Visit (INDEPENDENT_AMBULATORY_CARE_PROVIDER_SITE_OTHER): Payer: 59 | Admitting: Family Medicine

## 2015-11-26 VITALS — BP 110/70 | HR 81 | Temp 98.2°F | Resp 16 | Ht 62.0 in | Wt 162.0 lb

## 2015-11-26 DIAGNOSIS — E663 Overweight: Secondary | ICD-10-CM | POA: Diagnosis not present

## 2015-11-26 DIAGNOSIS — Z23 Encounter for immunization: Secondary | ICD-10-CM | POA: Diagnosis not present

## 2015-11-26 DIAGNOSIS — E785 Hyperlipidemia, unspecified: Secondary | ICD-10-CM | POA: Diagnosis not present

## 2015-11-26 DIAGNOSIS — E669 Obesity, unspecified: Secondary | ICD-10-CM | POA: Insufficient documentation

## 2015-11-26 DIAGNOSIS — R928 Other abnormal and inconclusive findings on diagnostic imaging of breast: Secondary | ICD-10-CM | POA: Diagnosis not present

## 2015-11-26 DIAGNOSIS — Z6379 Other stressful life events affecting family and household: Secondary | ICD-10-CM

## 2015-11-26 MED ORDER — SERTRALINE HCL 50 MG PO TABS: 50.0000 mg | ORAL_TABLET | Freq: Every day | ORAL | 1 refills | Status: DC

## 2015-11-26 NOTE — Patient Instructions (Addendum)
Increase the sertraline from 25 mg daily to 50 mg daily and give me an update in 3-4 weeks Keep up the great job you are doing with walking and weight loss Return in 4 months and have fasting labs done then Try to limit saturated fats in your diet (bologna, hot dogs, barbeque, cheeseburgers, hamburgers, steak, bacon, sausage, cheese, etc.) and get more fresh fruits, vegetables, and whole grains

## 2015-11-26 NOTE — Assessment & Plan Note (Signed)
Supportive listening; suggested counseling; prostate cancer support walk in November; discussed risk of gun in the home in men, but she does not think he would hurt himself or her; just want to plant that seed, have her remove guns or at least ammo if any concern; will increase sertraline and have her call me with update in 3-4 weeks

## 2015-11-26 NOTE — Assessment & Plan Note (Signed)
Reviewed the last [place] and mammo; order new

## 2015-11-26 NOTE — Progress Notes (Signed)
BP 110/70 (BP Location: Left Arm, Patient Position: Sitting, Cuff Size: Normal)   Pulse 81   Temp 98.2 F (36.8 C) (Oral)   Resp 16   Ht 5\' 2"  (1.575 m)   Wt 162 lb (73.5 kg)   SpO2 92%   BMI 29.63 kg/m    Subjective:    Patient ID: Laura Harper, female    DOB: Nov 11, 1950, 64 y.o.   MRN: [id number]  HPI: GISSELA Harper is a 65 y.o. female  Chief Complaint  Patient presents with  . Follow-up    Weight managemenrt    I saw patient last on Sept 14th for high cholesterol and obesity  She is a stress eater and was going to work on weight loss She is walking every chance she gets at home; husband mowed her a path She has tried cutting out sugar; changed her snack item; not drinking calories other than eating out She is maybe drinking enough water, but maybe more Has a fit bit and it helps her get up and walks around  Clocks about 6,000 steps at work a day, just about every day Has hit 13,000 steps some day She is hopeful that as she brings her weight down, perhaps she can get off of the cholesterol medicine Lab Results  Component Value Date   CHOL 192 10/19/2015   CHOL 257 (H) 04/15/2015   Lab Results  Component Value Date   HDL 64 10/19/2015   HDL 57 04/15/2015   Lab Results  Component Value Date   LDLCALC 101 (H) 10/19/2015   LDLCALC 166 (H) 04/15/2015   Lab Results  Component Value Date   TRIG 137 10/19/2015   TRIG 171 (H) 04/15/2015   Lab Results  Component Value Date   CHOLHDL 4.5 (H) 04/15/2015   No results found for: LDLDIRECT  Husband has cancer; he is in the anger stage; he is in year number two; it has metastasized; she is not the victim of abuse in anyway; they have guns in the home, but she is not worried in the least that he would use them against himself or her; she just has some stress and wonders if she can increase the sertraline  She had an abnormal mammo in the spring; due for 6 month f/u; I reviewed the reports; she had asymmetry in upper  outer RIGHT breast; patient is not able to feel anything there  Depression screen [facility] 2/9 11/26/2015 10/15/2015 04/13/2015 01/13/2015  Decreased Interest 0 0 0 -  Down, Depressed, Hopeless 0 0 1 3  PHQ - 2 Score 0 0 1 3  Altered sleeping - - - 3  Tired, decreased energy - - - 3  Change in appetite - - - 0  Feeling bad or failure about yourself  - - - 1  Trouble concentrating - - - 1  Moving slowly or fidgety/restless - - - 0  Suicidal thoughts - - - 0  PHQ-9 Score - - - 11   Relevant past medical, surgical, family and social history reviewed Past Medical History:  Diagnosis Date  . Allergy   . Brain aneurysm   . GERD (gastroesophageal reflux disease)    Past Surgical History:  Procedure Laterality Date  . COLONOSCOPY WITH PROPOFOL N/A 03/23/2015   Procedure: COLONOSCOPY WITH PROPOFOL;  Surgeon: Manya Silvas, MD;  Location[facility] END[facility]COPY;  Service: Endoscopy;  Laterality: N/A;  . TONSILLECTOMY Bilateral   . TUBAL LIGATION     Family History  Problem Relation Age of Onset  . Anxiety disorder Mother   . Cancer Mother   . Aneurysm Mother     subarachnoid hemorrhage  . Mood Disorder Father   . Coronary artery disease Father   . Depression Sister   . Depression Daughter   . Heart attack Brother   . Cancer Maternal Aunt     breast  . Breast cancer Maternal Aunt 36   Social History  Substance Use Topics  . Smoking status: Never Smoker  . Smokeless tobacco: Not on file  . Alcohol use No   Interim medical history since last visit reviewed. Allergies and medications reviewed  Review of Systems Per HPI unless specifically indicated above     Objective:    BP 110/70 (BP Location: Left Arm, Patient Position: Sitting, Cuff Size: Normal)   Pulse 81   Temp 98.2 F (36.8 C) (Oral)   Resp 16   Ht 5\' 2"  (1.575 m)   Wt 162 lb (73.5 kg)   SpO2 92%   BMI 29.63 kg/m   Wt Readings from Last 3 Encounters:  11/26/15 162 lb (73.5 kg)  10/15/15 167 lb 12.8 oz (76.1 kg)    04/13/15 169 lb (76.7 kg)   Physical Exam  Constitutional: She appears well-developed and well-nourished. No distress.  Overweight, weight down 5+ pounds since last visit  HENT:  Head: Normocephalic and atraumatic.  Eyes: EOM are normal. No scleral icterus.  Neck: No thyromegaly present.  Cardiovascular: Normal rate.   Pulmonary/Chest: Effort normal.  Abdominal: She exhibits no distension.  Musculoskeletal: Normal range of motion. She exhibits no edema.  Neurological: She is alert. She exhibits normal muscle tone.  Skin: Skin is warm and dry. She is not diaphoretic. No pallor.  Psychiatric: She has a normal mood and affect. Her mood appears not anxious. She does not exhibit a depressed mood.   Results for orders placed or performed in visit on 10/19/15  Comprehensive metabolic panel  Result Value Ref Range   Glucose 110 (H) 65 - 99 mg/dL   BUN 13 8 - 27 mg/dL   Creatinine, Ser 0.79 0.57 - 1.00 mg/dL   GFR calc non Af Amer 80 >59 mL/min/1.73   GFR calc Af Amer 93 >59 mL/min/1.73   BUN/Creatinine Ratio 16 12 - 28   Sodium 144 134 - 144 mmol/L   Potassium 5.2 3.5 - 5.2 mmol/L   Chloride 105 96 - 106 mmol/L   CO2 24 18 - 29 mmol/L   Calcium 9.7 8.7 - 10.3 mg/dL   Total Protein 7.0 6.0 - 8.5 g/dL   Albumin 4.5 3.6 - 4.8 g/dL   Globulin, Total 2.5 1.5 - 4.5 g/dL   Albumin/Globulin Ratio 1.8 1.2 - 2.2   Bilirubin Total 0.2 0.0 - 1.2 mg/dL   Alkaline Phosphatase 115 39 - 117 IU/L   AST 20 0 - 40 IU/L   ALT 19 0 - 32 IU/L  Lipid Panel With LDL/HDL Ratio  Result Value Ref Range   Cholesterol, Total 192 100 - 199 mg/dL   Triglycerides 137 0 - 149 mg/dL   HDL 64 >39 mg/dL   VLDL Cholesterol Cal 27 5 - 40 mg/dL   LDL Calculated 101 (H) 0 - 99 mg/dL   LDl/HDL Ratio 1.6 0.0 - 3.2 ratio units      Assessment & Plan:   Problem List Items Addressed This Visit      Other   Stress due to illness of family member  Supportive listening; suggested counseling; prostate cancer  support walk in November; discussed risk of gun in the home in men, but she does not think he would hurt himself or her; just want to plant that seed, have her remove guns or at least ammo if any concern; will increase sertraline and have her call me with update in 3-4 weeks      Overweight (BMI 25.0-29.9) - Primary    Praised patient for her hard work and success in getting out of the obesity category; risk of heart attack, breast cancer, etc all drops; keep up the effort because it is paying off      Hyperlipidemia LDL goal <100 (Chronic)    Reviewed last two lipid panels, LDL dropped from 166 to 101; reviewed healthy BMI, hopefully she can stop the lipids and we can recheck, will see her back in 4 months; avoid/limit foods with saturated fats; see AVS      Abnormal mammogram of right breast    Reviewed the last [place] and mammo; order new      Relevant Orders   MM DIAG BREAST TOMO UNI RIGHT   US BREAST LTD UNI RIGHT INC AXILLA    Other Visit Diagnoses    Needs flu shot       Relevant Orders   Flu Vaccine QUAD 36+ mos PF IM (Fluarix & Fluzone Quad PF) (Completed)      Follow up plan: Return in about 4 months (around 03/28/2016).  An after-visit summary was printed and given to the patient at [place].  Please see the patient instructions which may contain other information and recommendations beyond what is mentioned above in the assessment and plan.  Meds ordered this encounter  Medications  . sertraline (ZOLOFT) 50 MG tablet    Sig: Take 1 tablet (50 mg total) by mouth daily.    Dispense:  90 tablet    Refill:  1    Pharmacy, we're changing the dose, stop the 25 mg; please send 50 mg strength now    Orders Placed This Encounter  Procedures  . MM DIAG BREAST TOMO UNI RIGHT  . US BREAST LTD UNI RIGHT INC AXILLA  . Flu Vaccine QUAD 36+ mos PF IM (Fluarix & Fluzone Quad PF)

## 2015-11-26 NOTE — Assessment & Plan Note (Signed)
Reviewed last two lipid panels, LDL dropped from 166 to 101; reviewed healthy BMI, hopefully she can stop the lipids and we can recheck, will see her back in 4 months; avoid/limit foods with saturated fats; see AVS

## 2015-11-26 NOTE — Assessment & Plan Note (Signed)
Praised patient for her hard work and success in getting out of the obesity category; risk of heart attack, breast cancer, etc all drops; keep up the effort because it is paying off

## 2015-12-30 ENCOUNTER — Ambulatory Visit
Admission: RE | Admit: 2015-12-30 | Discharge: 2015-12-30 | Disposition: A | Discharge status: Home / Self Care | Payer: 59 | Source: Ambulatory Visit | Attending: Family Medicine | Admitting: Family Medicine

## 2015-12-30 ENCOUNTER — Ambulatory Visit: Admission: RE | Admit: 2015-12-30 | Payer: 59 | Source: Ambulatory Visit

## 2015-12-30 DIAGNOSIS — R928 Other abnormal and inconclusive findings on diagnostic imaging of breast: Secondary | ICD-10-CM | POA: Insufficient documentation

## 2015-12-31 ENCOUNTER — Ambulatory Visit (INDEPENDENT_AMBULATORY_CARE_PROVIDER_SITE_OTHER): Payer: 59 | Admitting: Family Medicine

## 2015-12-31 ENCOUNTER — Encounter: Payer: Self-pay | Admitting: Family Medicine

## 2015-12-31 DIAGNOSIS — F330 Major depressive disorder, recurrent, mild: Secondary | ICD-10-CM

## 2015-12-31 MED ORDER — SERTRALINE HCL 100 MG PO TABS
100.0000 mg | ORAL_TABLET | Freq: Every day | ORAL | 1 refills | Status: DC
Start: 2015-12-31 — End: 2016-03-29

## 2015-12-31 NOTE — Patient Instructions (Signed)
Increase the sertraline to 75 mg daily for six days, then go to 100 mg daily I'll recommend that you start working with a counselor Just call me in 3-4 weeks with an update  12 Ways to Curb Anxiety  ?Anxiety is normal human sensation. It is what helped our ancestors survive the pitfalls of the wilderness. Anxiety is defined as experiencing worry or nervousness about an imminent event or something with an uncertain outcome. It is a feeling experienced by most people at some point in their lives. Anxiety can be triggered by a very personal issue, such as the illness of a loved one, or an event of global proportions, such as a refugee crisis. Some of the symptoms of anxiety are:  Feeling restless.  Having a feeling of impending danger.  Increased heart rate.  Rapid breathing. Sweating.  Shaking.  Weakness or feeling tired.  Difficulty concentrating on anything except the current worry.  Insomnia.  Stomach or bowel problems. What can we do about anxiety we may be feeling? There are many techniques to help manage stress and relax. Here are 12 ways you can reduce your anxiety almost immediately: 1. Turn off the constant feed of information. Take a social media sabbatical. Studies have shown that social media directly contributes to social anxiety.  2. Monitor your television viewing habits. Are you watching shows that are also contributing to your anxiety, such as 24-hour news stations? Try watching something else, or better yet, nothing at all. Instead, listen to music, read an inspirational book or practice a hobby. 3. Eat nutritious meals. Also, don't skip meals and keep healthful snacks on hand. Hunger and poor diet contributes to feeling anxious. 4. Sleep. Sleeping on a regular schedule for at least seven to eight hours a night will do wonders for your outlook when you are awake. 5. Exercise. Regular exercise will help rid your body of that anxious energy and help you get more restful  sleep. 6. Try deep (diaphragmatic) breathing. Inhale slowly through your nose for five seconds and exhale through your mouth. 7. Practice acceptance and gratitude. When anxiety hits, accept that there are things out of your control that shouldn't be of immediate concern.  8. Seek out humor. When anxiety strikes, watch a funny video, read jokes or call a friend who makes you laugh. Laughter is healing for our bodies and releases endorphins that are calming. 9. Stay positive. Take the effort to replace negative thoughts with positive ones. Try to see a stressful situation in a positive light. Try to come up with solutions rather than dwelling on the problem. 10. Figure out what triggers your anxiety. Keep a journal and make note of anxious moments and the events surrounding them. This will help you identify triggers you can avoid or even eliminate. 11. Talk to someone. Let a trusted friend, family member or even trained professional know that you are feeling overwhelmed and anxious. Verbalize what you are feeling and why.  12. Volunteer. If your anxiety is triggered by a crisis on a large scale, become an advocate and work to resolve the problem that is causing you unease. Anxiety is often unwelcome and can become overwhelming. If not kept in check, it can become a disorder that could require medical treatment. However, if you take the time to care for yourself and avoid the triggers that make you anxious, you will be able to find moments of relaxation and clarity that make your life much more enjoyable.   Steps to Elicit  the Relaxation Response The following is the technique reprinted with permission from Dr. Billie Ruddy book The Relaxation Response pages 162-163 1. Sit quietly in a comfortable position. 2. Close your eyes. 3. Deeply relax all your muscles,  beginning at your feet and progressing up to your face.  Keep them relaxed. 4. Breathe through your nose.  Become aware of your  breathing.  As you breathe out, say the word, "one"*,  silently to yourself. For example,  breathe in ... out, "one",- in .. out, "one", etc.  Breathe easily and naturally. 5. Continue for 10 to 20 minutes.  You may open your eyes to check the time, but do not use an alarm.  When you finish, sit quietly for several minutes,  at first with your eyes closed and later with your eyes opened.  Do not stand up for a few minutes. 6. Do not worry about whether you are successful  in achieving a deep level of relaxation.  Maintain a passive attitude and permit relaxation to occur at its own pace.  When distracting thoughts occur,  try to ignore them by not dwelling upon them  and return to repeating "one."  With practice, the response should come with little effort.  Practice the technique once or twice daily,  but not within two hours after any meal,  since the digestive processes seem to interfere with  the elicitation of the Relaxation Response. * It is better to use a soothing, mellifluous sound, preferably with no meaning. or association, to avoid stimulation of unnecessary thoughts - a mantra.

## 2015-12-31 NOTE — Progress Notes (Signed)
BP 116/70   Pulse 66   Temp 98.2 F (36.8 C) (Oral)   Resp 14   Wt 158 lb (71.7 kg)   SpO2 97%   BMI 28.90 kg/m    Subjective:    Patient ID: Laura Harper, female    DOB: 1950-11-19, 65 y.o.   MRN: [id number]  HPI: Laura Harper is a 65 y.o. female  Chief Complaint  Patient presents with  . Depression   Patient is here for depression, adjustment issues dealing with sick loved one Her husband has cancer; he has stage 4 metastatic prostate cancer; place in his jaw, has to do hyperbaric treatment; grouchy and fights with the doctor; he is not handling the change well She scratched the hide off of her middle finger in her sleep Sleeps okay, no night-time awakenings Appetite is the same; trying to lose weight though on purpose; almost 10 pounds since September, limiting portion; really wanting to get off cholesterol medicine; drinking more water No thoughts of self-harm; no thoughts of hurting husband  Depression screen [facility] 2/9 12/31/2015 11/26/2015 10/15/2015 04/13/2015 01/13/2015  Decreased Interest 1 0 0 0 -  Down, Depressed, Hopeless 1 0 0 1 3  PHQ - 2 Score 2 0 0 1 3  Altered sleeping 0 - - - 3  Tired, decreased energy 0 - - - 3  Change in appetite 0 - - - 0  Feeling bad or failure about yourself  0 - - - 1  Trouble concentrating 2 - - - 1  Moving slowly or fidgety/restless 0 - - - 0  Suicidal thoughts 0 - - - 0  PHQ-9 Score 4 - - - 11  Difficult doing work/chores Somewhat difficult - - - -   Relevant past medical, surgical, family and social history reviewed Past Medical History:  Diagnosis Date  . Allergy   . Brain aneurysm   . GERD (gastroesophageal reflux disease)    Family History  Problem Relation Age of Onset  . Anxiety disorder Mother   . Cancer Mother   . Aneurysm Mother     subarachnoid hemorrhage  . Mood Disorder Father   . Coronary artery disease Father   . Depression Daughter   . Heart attack Brother   . Depression Sister   . Cancer Maternal  Aunt     breast  . Breast cancer Maternal Aunt 68   Social History  Substance Use Topics  . Smoking status: Never Smoker  . Smokeless tobacco: Never Used  . Alcohol use No   Interim medical history since last visit reviewed. Allergies and medications reviewed  Review of Systems Per HPI unless specifically indicated above     Objective:    BP 116/70   Pulse 66   Temp 98.2 F (36.8 C) (Oral)   Resp 14   Wt 158 lb (71.7 kg)   SpO2 97%   BMI 28.90 kg/m   Wt Readings from Last 3 Encounters:  12/31/15 158 lb (71.7 kg)  11/26/15 162 lb (73.5 kg)  10/15/15 167 lb 12.8 oz (76.1 kg)    Physical Exam  Constitutional: She appears well-developed and well-nourished. No distress.  Cardiovascular: Normal rate.   Pulmonary/Chest: Effort normal.  Neurological: She is alert. She displays no tremor.  No tics  Skin: Abrasion (finger, consistent with repetitive scratching) noted.  Psychiatric: Her mood appears not anxious. Her affect is not blunt, not labile and not inappropriate. Her speech is not rapid and/or pressured, not delayed and  not tangential. She is not slowed and not withdrawn. Cognition and memory are not impaired. She does not express impulsivity. She exhibits a depressed mood. She expresses no homicidal and no suicidal ideation.  Good eye contact with examiner She is attentive.      Assessment & Plan:   Problem List Items Addressed This Visit      Other   Major depressive disorder, recurrent episode ([place])    Supportive listening provided; recommended relaxation response, see AVS; encouraged her to start working with a counselor; list of providers given; increase SSRI; patient was asked to call me with an update in 3-4 weeks to save her the return visit since that will be right around Christmas, and she agrees to do so; call sooner if any issues      Relevant Medications   sertraline (ZOLOFT) 100 MG tablet      Follow up plan: No Follow-up on file.  An after-visit  summary was printed and given to the patient at [place].  Please see the patient instructions which may contain other information and recommendations beyond what is mentioned above in the assessment and plan.  Meds ordered this encounter  Medications  . sertraline (ZOLOFT) 100 MG tablet    Sig: Take 1 tablet (100 mg total) by mouth daily.    Dispense:  90 tablet    Refill:  1    We're increasing the dose; cancel the old Rx

## 2016-01-03 NOTE — Assessment & Plan Note (Addendum)
Supportive listening provided; recommended relaxation response, see AVS; encouraged her to start working with a counselor; list of providers given; increase SSRI; patient was asked to call me with an update in 3-4 weeks to save her the return visit since that will be right around Christmas, and she agrees to do so; call sooner if any issues

## 2016-01-08 ENCOUNTER — Other Ambulatory Visit: Payer: Self-pay | Admitting: Family Medicine

## 2016-03-29 ENCOUNTER — Encounter: Payer: Self-pay | Admitting: Family Medicine

## 2016-03-29 ENCOUNTER — Ambulatory Visit (INDEPENDENT_AMBULATORY_CARE_PROVIDER_SITE_OTHER): Payer: 59 | Admitting: Family Medicine

## 2016-03-29 DIAGNOSIS — E663 Overweight: Secondary | ICD-10-CM

## 2016-03-29 DIAGNOSIS — E785 Hyperlipidemia, unspecified: Secondary | ICD-10-CM | POA: Diagnosis not present

## 2016-03-29 DIAGNOSIS — R928 Other abnormal and inconclusive findings on diagnostic imaging of breast: Secondary | ICD-10-CM | POA: Diagnosis not present

## 2016-03-29 DIAGNOSIS — F330 Major depressive disorder, recurrent, mild: Secondary | ICD-10-CM | POA: Diagnosis not present

## 2016-03-29 DIAGNOSIS — Z5181 Encounter for therapeutic drug level monitoring: Secondary | ICD-10-CM | POA: Diagnosis not present

## 2016-03-29 DIAGNOSIS — R739 Hyperglycemia, unspecified: Secondary | ICD-10-CM

## 2016-03-29 DIAGNOSIS — I671 Cerebral aneurysm, nonruptured: Secondary | ICD-10-CM | POA: Diagnosis not present

## 2016-03-29 MED ORDER — SERTRALINE HCL 100 MG PO TABS
100.0000 mg | ORAL_TABLET | Freq: Every day | ORAL | 3 refills | Status: DC
Start: 2016-03-29 — End: 2017-05-12

## 2016-03-29 NOTE — Assessment & Plan Note (Signed)
Imaging due Jun 28, 2016: Bilateral diagnostic mammogram and possible ultrasound in 6 months.

## 2016-03-29 NOTE — Assessment & Plan Note (Signed)
Doing well on the SSRI; continue

## 2016-03-29 NOTE — Assessment & Plan Note (Signed)
Monitor sgpt on statin 

## 2016-03-29 NOTE — Progress Notes (Signed)
BP 114/68   Pulse 78   Temp 98 F (36.7 C) (Oral)   Resp 16   Wt 157 lb 6.4 oz (71.4 kg)   SpO2 99%   BMI 28.79 kg/m    Subjective:    Patient ID: Laura Harper, female    DOB: [birth year], 66 y.o.   MRN: [id number]  HPI: Laura Harper is a 66 y.o. female  Chief Complaint  Patient presents with  . Follow-up   Patient is here for follow-up of several issues  She was seen last in November; note reviewed; at the time, she was struggling with depression; PHQ-9 score was 4; husband was dealing with stage 4 cancer; we increased her sertraline; she talked to him about his behavior and he settled down; she quit going to his appts where he was showing himself; less stress; she is sleeping better, doing better in terms of stress  She has high cholesterol; last labs done 10/19/15; total 192, HDL 64, LDL 101, significant drop from previous (total was 257, LDL was 166 11 months ago) On statin; no muscle aches, "they've eased off"; she'd love to decrease med if able; she has always been a good eater, not much exercise; no weight loss  Elevated glucose at last visit; one aunt had diabetes maybe; has dry mouth; no real blurred vision  Had the flu a few weeks ago  Cerebral aneurysm; sent to neurologist by previous provider; artery in her neck was abnormal, wanted to get MRI and they never seemed to get it set up; no imaging in the last two years; would like a local neurologist  She had a previous abnormal mammogram and then needs recheck 6 months later  Depression screen [facility] 2/9 03/29/2016 12/31/2015 11/26/2015 10/15/2015 04/13/2015  Decreased Interest 0 1 0 0 0  Down, Depressed, Hopeless 0 1 0 0 1  PHQ - 2 Score 0 2 0 0 1  Altered sleeping - 0 - - -  Tired, decreased energy - 0 - - -  Change in appetite - 0 - - -  Feeling bad or failure about yourself  - 0 - - -  Trouble concentrating - 2 - - -  Moving slowly or fidgety/restless - 0 - - -  Suicidal thoughts - 0 - - -  PHQ-9 Score - 4 - -  -  Difficult doing work/chores - Somewhat difficult - - -    No flowsheet data found.  Relevant past medical, surgical, family and social history reviewed Past Medical History:  Diagnosis Date  . Allergy   . Brain aneurysm   . GERD (gastroesophageal reflux disease)    Past Surgical History:  Procedure Laterality Date  . COLONOSCOPY WITH PROPOFOL N/A 03/23/2015   Procedure: COLONOSCOPY WITH PROPOFOL;  Surgeon: Manya Silvas, MD;  Location: [facility] ENDOSCOPY;  Service: Endoscopy;  Laterality: N/A;  . TONSILLECTOMY Bilateral   . TUBAL LIGATION     Family History  Problem Relation Age of Onset  . Anxiety disorder Mother   . Aneurysm Mother     subarachnoid hemorrhage  . Thyroid disease Mother   . Mood Disorder Father   . Coronary artery disease Father   . Heart attack Father   . Depression Daughter   . Heart attack Brother   . Thyroid disease Sister   . Cancer Maternal Aunt     breast  . Breast cancer Maternal Aunt 52   Social History  Substance Use Topics  . Smoking status: Never  Smoker  . Smokeless tobacco: Never Used  . Alcohol use No    Interim medical history since last visit reviewed. Allergies and medications reviewed  Review of Systems Per HPI unless specifically indicated above     Objective:    BP 114/68   Pulse 78   Temp 98 F (36.7 C) (Oral)   Resp 16   Wt 157 lb 6.4 oz (71.4 kg)   SpO2 99%   BMI 28.79 kg/m   Wt Readings from Last 3 Encounters:  03/29/16 157 lb 6.4 oz (71.4 kg)  12/31/15 158 lb (71.7 kg)  11/26/15 162 lb (73.5 kg)    Physical Exam  Constitutional: She appears well-developed and well-nourished. No distress.  Overweight, weight down 5+ pounds since October  HENT:  Head: Normocephalic and atraumatic.  Eyes: EOM are normal. No scleral icterus.  Neck: No thyromegaly present.  Cardiovascular: Normal rate and regular rhythm.   Pulmonary/Chest: Effort normal and breath sounds normal.  Abdominal: Soft. She exhibits no  distension. There is no tenderness.  Musculoskeletal: Normal range of motion. She exhibits no edema.  Neurological: She is alert. She exhibits normal muscle tone.  Skin: Skin is warm and dry. She is not diaphoretic. No pallor.  Psychiatric: She has a normal mood and affect. Her mood appears not anxious. She does not exhibit a depressed mood.    Results for orders placed or performed in visit on 10/19/15  Comprehensive metabolic panel  Result Value Ref Range   Glucose 110 (H) 65 - 99 mg/dL   BUN 13 8 - 27 mg/dL   Creatinine, Ser 0.79 0.57 - 1.00 mg/dL   GFR calc non Af Amer 80 >59 mL/min/1.73   GFR calc Af Amer 93 >59 mL/min/1.73   BUN/Creatinine Ratio 16 12 - 28   Sodium 144 134 - 144 mmol/L   Potassium 5.2 3.5 - 5.2 mmol/L   Chloride 105 96 - 106 mmol/L   CO2 24 18 - 29 mmol/L   Calcium 9.7 8.7 - 10.3 mg/dL   Total Protein 7.0 6.0 - 8.5 g/dL   Albumin 4.5 3.6 - 4.8 g/dL   Globulin, Total 2.5 1.5 - 4.5 g/dL   Albumin/Globulin Ratio 1.8 1.2 - 2.2   Bilirubin Total 0.2 0.0 - 1.2 mg/dL   Alkaline Phosphatase 115 39 - 117 IU/L   AST 20 0 - 40 IU/L   ALT 19 0 - 32 IU/L  Lipid Panel With LDL/HDL Ratio  Result Value Ref Range   Cholesterol, Total 192 100 - 199 mg/dL   Triglycerides 137 0 - 149 mg/dL   HDL 64 >39 mg/dL   VLDL Cholesterol Cal 27 5 - 40 mg/dL   LDL Calculated 101 (H) 0 - 99 mg/dL   LDl/HDL Ratio 1.6 0.0 - 3.2 ratio units      Assessment & Plan:   Problem List Items Addressed This Visit      Cardiovascular and Mediastinum   Aneurysm, cerebral, nonruptured    Refer to local neurologist      Relevant Orders   Ambulatory referral to Neurology     Other   Overweight (BMI 25.0-29.9)    Losing weight, praise given      Major depressive disorder, recurrent episode ([place])    Doing well on the SSRI; continue      Relevant Medications   sertraline (ZOLOFT) 100 MG tablet   Hyperlipidemia LDL goal <100 (Chronic)    Limit saturated fats; check lipids today  (fasting); on statin  Relevant Orders   Lipid panel   Hyperglycemia    Check glucose (truly fasting) and A1c      Relevant Orders   Hemoglobin A1c   Encounter for medication monitoring    Monitor sgpt on statin      Relevant Orders   Comprehensive Metabolic Panel (CMET)   Abnormal mammogram of right breast    Imaging due Jun 28, 2016: Bilateral diagnostic mammogram and possible ultrasound in 6 months.          Follow up plan: Return in about 6 months (around 09/26/2016) for fasting labs and visit.  An after-visit summary was printed and given to the patient at [place].  Please see the patient instructions which may contain other information and recommendations beyond what is mentioned above in the assessment and plan.  Meds ordered this encounter  Medications  . sertraline (ZOLOFT) 100 MG tablet    Sig: Take 1 tablet (100 mg total) by mouth daily.    Dispense:  90 tablet    Refill:  3    Orders Placed This Encounter  Procedures  . Hemoglobin A1c  . Lipid panel  . Comprehensive Metabolic Panel (CMET)  . Ambulatory referral to Neurology

## 2016-03-29 NOTE — Assessment & Plan Note (Signed)
Losing weight, praise given

## 2016-03-29 NOTE — Assessment & Plan Note (Signed)
Refer to local neurologist

## 2016-03-29 NOTE — Assessment & Plan Note (Signed)
Limit saturated fats; check lipids today (fasting); on statin

## 2016-03-29 NOTE — Assessment & Plan Note (Signed)
Check glucose (truly fasting) and A1c 

## 2016-03-29 NOTE — Patient Instructions (Signed)
Please have labs done at [facility] have you see Dr. Melrose Nakayama If you have not heard anything from my staff in a week about any orders/referrals/studies from today, please contact us here to follow-up [phone] Try to limit saturated fats in your diet (bologna, hot dogs, barbeque, cheeseburgers, hamburgers, steak, bacon, sausage, cheese, etc.) and get more fresh fruits, vegetables, and whole grains

## 2016-03-30 LAB — LIPID PANEL
CHOL/HDL RATIO: 3.8 ratio (ref 0.0–4.4)
Cholesterol, Total: 207 mg/dL — ABNORMAL HIGH (ref 100–199)
HDL: 55 mg/dL (ref >39)
LDL Calculated: 121 mg/dL — ABNORMAL HIGH (ref 0–99)
TRIGLYCERIDES: 157 mg/dL — AB (ref 0–149)
VLDL CHOLESTEROL CAL: 31 mg/dL (ref 5–40)

## 2016-03-30 LAB — COMPREHENSIVE METABOLIC PANEL
A/G RATIO: 1.8 (ref 1.2–2.2)
ALBUMIN: 4.4 g/dL (ref 3.6–4.8)
ALK PHOS: 107 IU/L (ref 39–117)
ALT: 28 IU/L (ref 0–32)
AST: 25 IU/L (ref 0–40)
BILIRUBIN TOTAL: 0.3 mg/dL (ref 0.0–1.2)
BUN / CREAT RATIO: 22 (ref 12–28)
BUN: 16 mg/dL (ref 8–27)
CHLORIDE: 104 mmol/L (ref 96–106)
CO2: 25 mmol/L (ref 18–29)
Calcium: 9.2 mg/dL (ref 8.7–10.3)
Creatinine, Ser: 0.74 mg/dL (ref 0.57–1.00)
GFR calc Af Amer: 100 mL/min/{1.73_m2} (ref >59)
GFR calc non Af Amer: 86 mL/min/{1.73_m2} (ref >59)
GLUCOSE: 103 mg/dL — AB (ref 65–99)
Globulin, Total: 2.4 g/dL (ref 1.5–4.5)
POTASSIUM: 4.3 mmol/L (ref 3.5–5.2)
Sodium: 143 mmol/L (ref 134–144)
Total Protein: 6.8 g/dL (ref 6.0–8.5)

## 2016-03-30 LAB — HEMOGLOBIN A1C
ESTIMATED AVERAGE GLUCOSE: 114 mg/dL
Hgb A1c MFr Bld: 5.6 % (ref 4.8–5.6)

## 2016-04-01 ENCOUNTER — Other Ambulatory Visit: Payer: Self-pay | Admitting: Family Medicine

## 2016-04-01 DIAGNOSIS — E785 Hyperlipidemia, unspecified: Secondary | ICD-10-CM

## 2016-04-01 DIAGNOSIS — Z5181 Encounter for therapeutic drug level monitoring: Secondary | ICD-10-CM

## 2016-04-01 MED ORDER — PRAVASTATIN SODIUM 40 MG PO TABS: 40.0000 mg | ORAL_TABLET | Freq: Every day | ORAL | 1 refills | Status: DC

## 2016-04-01 NOTE — Progress Notes (Signed)
Orders entered for lipids, sgpt; printed, up front by Suanne Marker

## 2016-04-25 DIAGNOSIS — I729 Aneurysm of unspecified site: Secondary | ICD-10-CM | POA: Insufficient documentation

## 2016-06-01 ENCOUNTER — Other Ambulatory Visit: Payer: Self-pay

## 2016-06-01 ENCOUNTER — Telehealth: Payer: Self-pay | Admitting: Family Medicine

## 2016-06-01 DIAGNOSIS — R928 Other abnormal and inconclusive findings on diagnostic imaging of breast: Secondary | ICD-10-CM

## 2016-06-01 DIAGNOSIS — N6489 Other specified disorders of breast: Secondary | ICD-10-CM

## 2016-06-01 NOTE — Telephone Encounter (Signed)
Time additional imaging later this month Please order: (from Dec 30, 2015 report) IMPRESSION: Probably benign right breast asymmetry.  RECOMMENDATION: Bilateral diagnostic mammogram and possible ultrasound in 6 months. ----------------------------- Thank you

## 2016-06-01 NOTE — Telephone Encounter (Signed)
Ordered and patient notified.  

## 2016-07-01 ENCOUNTER — Ambulatory Visit
Admission: RE | Admit: 2016-07-01 | Discharge: 2016-07-01 | Disposition: A | Discharge status: Home / Self Care | Payer: 59 | Source: Ambulatory Visit | Attending: Family Medicine | Admitting: Family Medicine

## 2016-07-01 ENCOUNTER — Ambulatory Visit: Admission: RE | Admit: 2016-07-01 | Payer: 59 | Source: Ambulatory Visit

## 2016-07-01 ENCOUNTER — Other Ambulatory Visit: Payer: Self-pay | Admitting: Neurology

## 2016-07-01 DIAGNOSIS — I729 Aneurysm of unspecified site: Secondary | ICD-10-CM

## 2016-07-01 DIAGNOSIS — N6489 Other specified disorders of breast: Secondary | ICD-10-CM | POA: Diagnosis not present

## 2016-07-01 LAB — HM MAMMOGRAPHY

## 2016-07-11 ENCOUNTER — Ambulatory Visit
Admission: RE | Admit: 2016-07-11 | Discharge: 2016-07-11 | Disposition: A | Discharge status: Home / Self Care | Payer: 59 | Source: Ambulatory Visit | Attending: Neurology | Admitting: Neurology

## 2016-07-11 DIAGNOSIS — I729 Aneurysm of unspecified site: Secondary | ICD-10-CM | POA: Diagnosis present

## 2016-07-11 DIAGNOSIS — I671 Cerebral aneurysm, nonruptured: Secondary | ICD-10-CM | POA: Insufficient documentation

## 2016-07-13 ENCOUNTER — Encounter: Payer: Self-pay | Admitting: Family Medicine

## 2016-07-13 ENCOUNTER — Ambulatory Visit (INDEPENDENT_AMBULATORY_CARE_PROVIDER_SITE_OTHER): Payer: 59 | Admitting: Family Medicine

## 2016-07-13 DIAGNOSIS — G8929 Other chronic pain: Secondary | ICD-10-CM

## 2016-07-13 DIAGNOSIS — M545 Low back pain: Secondary | ICD-10-CM

## 2016-07-13 DIAGNOSIS — M549 Dorsalgia, unspecified: Secondary | ICD-10-CM

## 2016-07-13 NOTE — Progress Notes (Signed)
BP 122/84   Pulse 65   Temp 98 F (36.7 C) (Oral)   Resp 14   Wt 158 lb 3.2 oz (71.8 kg)   SpO2 97%   BMI 28.94 kg/m    Subjective:    Patient ID: Laura Harper, female    DOB: [birth year], 66 y.o.   MRN: [id number]  HPI: Laura Harper is a 66 y.o. female  Chief Complaint  Patient presents with  . Back Pain    start at top radiates down  . Paperwork   HPI Patient is here for an acute visit She has back pain "My back hurts" It started hurting about two months ago She works 10 hours a day sitting down She would like a desk that elevates; she needs paperwork for that It turns out that her employer gave her ADA paperwork for disability and the need for accomodations Yesterday, her back hurt so bad she thought about crying Back of the left leg started tingling; not like a muscle pull From below the left shoulder blade and down No weakness in the left leg; no loss of B/B Bladder leaking is not new Thought at first she just pulled something Not sure what she has done Not going away Tried ibuprofen and that didn't help; motrin did not help, didn't take together; Bayer did not help No ice packs, heating pads, did not see chiropractor Works at [employer]; sits at [employer] all day long and works on a computer; two screens, sometimes three screens Desks are curved Not tightness; doesn't think a muscle relaxer would help No blood in urine  Depression screen [facility] 2/9 07/13/2016 03/29/2016 12/31/2015 11/26/2015 10/15/2015  Decreased Interest 0 0 1 0 0  Down, Depressed, Hopeless 0 0 1 0 0  PHQ - 2 Score 0 0 2 0 0  Altered sleeping - - 0 - -  Tired, decreased energy - - 0 - -  Change in appetite - - 0 - -  Feeling bad or failure about yourself  - - 0 - -  Trouble concentrating - - 2 - -  Moving slowly or fidgety/restless - - 0 - -  Suicidal thoughts - - 0 - -  PHQ-9 Score - - 4 - -  Difficult doing work/chores - - Somewhat difficult - -    Relevant past medical, surgical, family  and social history reviewed Past Medical History:  Diagnosis Date  . Allergy   . Brain aneurysm   . GERD (gastroesophageal reflux disease)    Past Surgical History:  Procedure Laterality Date  . COLONOSCOPY WITH PROPOFOL N/A 03/23/2015   Procedure: COLONOSCOPY WITH PROPOFOL;  Surgeon: Manya Silvas, MD;  Location: [facility] ENDOSCOPY;  Service: Endoscopy;  Laterality: N/A;  . TONSILLECTOMY Bilateral   . TUBAL LIGATION     Family History  Problem Relation Age of Onset  . Anxiety disorder Mother   . Aneurysm Mother        subarachnoid hemorrhage  . Thyroid disease Mother   . Mood Disorder Father   . Coronary artery disease Father   . Heart attack Father   . Depression Daughter   . Heart attack Brother   . Thyroid disease Sister   . Cancer Maternal Aunt        breast  . Breast cancer Maternal Aunt 34   Social History   Social History  . Marital status: Married    Spouse name: N/A  . Number of children: N/A  . Years  of education: N/A   Occupational History  . Not on file.   Social History Main Topics  . Smoking status: Never Smoker  . Smokeless tobacco: Never Used  . Alcohol use No  . Drug use: No  . Sexual activity: No   Other Topics Concern  . Not on file   Social History Narrative  . No narrative on file    Interim medical history since last visit reviewed. Allergies and medications reviewed  Review of Systems Per HPI unless specifically indicated above     Objective:    BP 122/84   Pulse 65   Temp 98 F (36.7 C) (Oral)   Resp 14   Wt 158 lb 3.2 oz (71.8 kg)   SpO2 97%   BMI 28.94 kg/m   Wt Readings from Last 3 Encounters:  07/13/16 158 lb 3.2 oz (71.8 kg)  03/29/16 157 lb 6.4 oz (71.4 kg)  12/31/15 158 lb (71.7 kg)    Physical Exam  Constitutional: She appears well-developed and well-nourished. No distress.  Overweight  HENT:  Head: Normocephalic and atraumatic.  Eyes: EOM are normal. No scleral icterus.  Neck: No thyromegaly present.    Cardiovascular: Normal rate and regular rhythm.   Pulmonary/Chest: Effort normal and breath sounds normal.  Abdominal: Soft. She exhibits no distension. There is no tenderness.  Musculoskeletal: Normal range of motion. She exhibits no edema.       Lumbar back: She exhibits normal range of motion, no tenderness, no bony tenderness, no swelling, no edema, no deformity and no spasm.  Neurological: She is alert. She exhibits normal muscle tone.  Reflex Scores:      Patellar reflexes are 2+ on the right side and 2+ on the left side. Skin: Skin is warm and dry. No rash noted. She is not diaphoretic. No pallor.  Psychiatric: She has a normal mood and affect. Her mood appears not anxious. She does not exhibit a depressed mood.       Assessment & Plan:   Problem List Items Addressed This Visit      Other   Chronic left-sided back pain    Believe musculoskeletal; wrote letter to see about getting a standing desk; recommend 4 hours a day standing or more, alternating through the day would be best; check urine to r/o hematuria or protein; no deformity on exam to suggest significant scoliosis and no neurologic deficits; patient politely declined PT; suggested turmeric; gentle stretching      Relevant Orders   Urinalysis w microscopic + reflex cultur      Follow up plan: No Follow-up on file.  An after-visit summary was printed and given to the patient at [place].  Please see the patient instructions which may contain other information and recommendations beyond what is mentioned above in the assessment and plan.  No orders of the defined types were placed in this encounter.  Orders Placed This Encounter  Procedures  . Urinalysis w microscopic + reflex cultur

## 2016-07-13 NOTE — Assessment & Plan Note (Signed)
Believe musculoskeletal; wrote letter to see about getting a standing desk; recommend 4 hours a day standing or more, alternating through the day would be best; check urine to r/o hematuria or protein; no deformity on exam to suggest significant scoliosis and no neurologic deficits; patient politely declined PT; suggested turmeric; gentle stretching

## 2016-07-13 NOTE — Patient Instructions (Signed)
Check out yoga as a means of stretching and strengthening your back and core Try turmeric as a natural anti-inflammatory (for pain and arthritis). It comes in capsules where you buy aspirin and fish oil, but also as a spice where you buy pepper and garlic powder. Use the letter to see about getting a standing desk provided at work

## 2016-07-14 ENCOUNTER — Ambulatory Visit: Payer: 59 | Admitting: Family Medicine

## 2016-07-14 LAB — URINALYSIS W MICROSCOPIC + REFLEX CULTURE
BACTERIA UA: NONE SEEN [HPF]
BILIRUBIN URINE: NEGATIVE
Casts: NONE SEEN [LPF]
Crystals: NONE SEEN [HPF]
Glucose, UA: NEGATIVE
Hgb urine dipstick: NEGATIVE
Ketones, ur: NEGATIVE
NITRITE: NEGATIVE
PH: 6 (ref 5.0–8.0)
PROTEIN: NEGATIVE
RBC / HPF: NONE SEEN RBC/HPF (ref <=2)
SPECIFIC GRAVITY, URINE: 1.01 (ref 1.001–1.035)
YEAST: NONE SEEN [HPF]

## 2016-07-15 LAB — URINE CULTURE

## 2016-07-25 ENCOUNTER — Other Ambulatory Visit: Payer: Self-pay | Admitting: Family Medicine

## 2016-07-28 ENCOUNTER — Telehealth: Payer: Self-pay | Admitting: Family Medicine

## 2016-07-28 ENCOUNTER — Other Ambulatory Visit: Payer: Self-pay

## 2016-07-28 DIAGNOSIS — E785 Hyperlipidemia, unspecified: Secondary | ICD-10-CM

## 2016-07-28 NOTE — Telephone Encounter (Signed)
Done and ordered up front

## 2016-07-28 NOTE — Telephone Encounter (Signed)
PT SAID THAT SHE RECEIVED A CALL SAYING THAT SHE NEEDED TO HAVE CHOLESTEROL LABS DONE BEFORE THEY COULD REFILL HER MEDICATION. SHE NEED ORDER TO BE FOR [facility].

## 2016-09-06 ENCOUNTER — Encounter: Payer: Self-pay | Admitting: Family Medicine

## 2016-09-06 LAB — LIPID PANEL
CHOLESTEROL TOTAL: 243 mg/dL — AB (ref 100–199)
Chol/HDL Ratio: 3.9 ratio (ref 0.0–4.4)
HDL: 63 mg/dL (ref >39)
LDL CALC: 156 mg/dL — AB (ref 0–99)
TRIGLYCERIDES: 122 mg/dL (ref 0–149)
VLDL Cholesterol Cal: 24 mg/dL (ref 5–40)

## 2016-09-06 LAB — ALT: ALT: 12 IU/L (ref 0–32)

## 2016-09-07 ENCOUNTER — Other Ambulatory Visit: Payer: Self-pay | Admitting: Family Medicine

## 2016-09-07 MED ORDER — PRAVASTATIN SODIUM 40 MG PO TABS: 40.0000 mg | ORAL_TABLET | Freq: Every day | ORAL | 1 refills | Status: DC

## 2016-09-28 ENCOUNTER — Ambulatory Visit (INDEPENDENT_AMBULATORY_CARE_PROVIDER_SITE_OTHER): Payer: 59 | Admitting: Family Medicine

## 2016-09-28 ENCOUNTER — Encounter: Payer: Self-pay | Admitting: Family Medicine

## 2016-09-28 VITALS — BP 122/72 | HR 80 | Temp 98.0°F | Resp 14 | Wt 160.9 lb

## 2016-09-28 DIAGNOSIS — K219 Gastro-esophageal reflux disease without esophagitis: Secondary | ICD-10-CM

## 2016-09-28 DIAGNOSIS — Z23 Encounter for immunization: Secondary | ICD-10-CM

## 2016-09-28 DIAGNOSIS — R739 Hyperglycemia, unspecified: Secondary | ICD-10-CM

## 2016-09-28 DIAGNOSIS — I671 Cerebral aneurysm, nonruptured: Secondary | ICD-10-CM | POA: Diagnosis not present

## 2016-09-28 DIAGNOSIS — E663 Overweight: Secondary | ICD-10-CM

## 2016-09-28 DIAGNOSIS — K5904 Chronic idiopathic constipation: Secondary | ICD-10-CM

## 2016-09-28 DIAGNOSIS — Z5181 Encounter for therapeutic drug level monitoring: Secondary | ICD-10-CM

## 2016-09-28 DIAGNOSIS — E785 Hyperlipidemia, unspecified: Secondary | ICD-10-CM

## 2016-09-28 MED ORDER — PLECANATIDE 3 MG PO TABS: 1.0000 | ORAL_TABLET | Freq: Every day | ORAL | 11 refills | Status: DC

## 2016-09-28 MED ORDER — RANITIDINE HCL 300 MG PO TABS: 300.0000 mg | ORAL_TABLET | Freq: Every day | ORAL | 3 refills | Status: DC

## 2016-09-28 NOTE — Assessment & Plan Note (Signed)
Try to limit portions, drink more water, more active as weather cools off

## 2016-09-28 NOTE — Assessment & Plan Note (Signed)
Stable, followed by neurologist

## 2016-09-28 NOTE — Assessment & Plan Note (Signed)
Check A1c and glucose 

## 2016-09-28 NOTE — Patient Instructions (Addendum)
Please have fasting labs done between October 10th and October 24th Try to limit saturated fats in your diet (bologna, hot dogs, barbeque, cheeseburgers, hamburgers, steak, bacon, sausage, cheese, etc.) and get more fresh fruits, vegetables, and whole grains Try the new medicine for constipation Try the new medicine for heartburn Caution: prolonged use of proton pump inhibitors like omeprazole (Prilosec), pantoprazole (Protonix), esomeprazole (Nexium), and others like Dexilant and Aciphex may increase your risk of pneumonia, Clostridium difficile colitis, osteoporosis, anemia and other health complications Try to limit or avoid triggers like coffee, caffeinated beverages, onions, chocolate, spicy foods, peppermint, acidic foods like pizza, spaghetti sauce, and orange juice Lose weight if you are overweight or obese Try elevating the head of your bed by placing a small wedge between your mattress and box springs to keep acid in the stomach at night instead of coming up into your esophagus

## 2016-09-28 NOTE — Assessment & Plan Note (Signed)
Check fasting lipids after being on statinn for 6-8 weeks; advised limiting foods with saturated fats

## 2016-09-28 NOTE — Assessment & Plan Note (Signed)
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

## 2016-09-28 NOTE — Progress Notes (Signed)
BP 122/72   Pulse 80   Temp 98 F (36.7 C) (Oral)   Resp 14   Wt 160 lb 14.4 oz (73 kg)   SpO2 96%   BMI 29.43 kg/m    Subjective:    Patient ID: Laura Harper, female    DOB: Nov 12, 1950, 66 y.o.   MRN: [id number]  HPI: Laura Harper is a 66 y.o. female  Chief Complaint  Patient presents with  . Follow-up    HPI Patient is here for f/u; last visit June 13th She has high cholesterol; she sent a MYChart message explaining that she ran out of her cholesterol medicine (see Aug 7th note); Rx sent in and she is back on now (just got yesterday); occasional hot dog, but not many fatty meats; not many eggs; puts some cheese; rare milk; does like ice cream; hard to exercise when hot outside High cholesterol ran in her family, mother and aunt  Constipation; unchanged; her whole life; colonoscopy UTD; tried OTC products; keeps miralax at home  GERD; takes prilosec but not aware of risks; not sure what triggers she has  Brain aneurysm; followed by Dr. Melrose Nakayama; all the same  Depression screen [facility] 2/9 09/28/2016 07/13/2016 03/29/2016 12/31/2015 11/26/2015  Decreased Interest 0 0 0 1 0  Down, Depressed, Hopeless 0 0 0 1 0  PHQ - 2 Score 0 0 0 2 0  Altered sleeping - - - 0 -  Tired, decreased energy - - - 0 -  Change in appetite - - - 0 -  Feeling bad or failure about yourself  - - - 0 -  Trouble concentrating - - - 2 -  Moving slowly or fidgety/restless - - - 0 -  Suicidal thoughts - - - 0 -  PHQ-9 Score - - - 4 -  Difficult doing work/chores - - - Somewhat difficult -    Relevant past medical, surgical, family and social history reviewed Past Medical History:  Diagnosis Date  . Allergy   . Brain aneurysm   . GERD (gastroesophageal reflux disease)    Past Surgical History:  Procedure Laterality Date  . COLONOSCOPY WITH PROPOFOL N/A 03/23/2015   Procedure: COLONOSCOPY WITH PROPOFOL;  Surgeon: Manya Silvas, MD;  Location: [facility] ENDOSCOPY;  Service: Endoscopy;  Laterality:  N/A;  . TONSILLECTOMY Bilateral   . TUBAL LIGATION     Family History  Problem Relation Age of Onset  . Anxiety disorder Mother   . Aneurysm Mother        subarachnoid hemorrhage  . Thyroid disease Mother   . Mood Disorder Father   . Coronary artery disease Father   . Heart attack Father   . Depression Daughter   . Heart attack Brother   . Thyroid disease Sister   . Cancer Maternal Aunt        breast  . Breast cancer Maternal Aunt 59   Social History   Social History  . Marital status: Married    Spouse name: N/A  . Number of children: N/A  . Years of education: N/A   Occupational History  . Not on file.   Social History Main Topics  . Smoking status: Never Smoker  . Smokeless tobacco: Never Used  . Alcohol use No  . Drug use: No  . Sexual activity: No   Other Topics Concern  . Not on file   Social History Narrative  . No narrative on file    Interim medical history since last  visit reviewed. Allergies and medications reviewed  Review of Systems  Respiratory: Negative for shortness of breath.   Cardiovascular: Negative for chest pain.  Gastrointestinal: Negative for blood in stool.   Per HPI unless specifically indicated above     Objective:    BP 122/72   Pulse 80   Temp 98 F (36.7 C) (Oral)   Resp 14   Wt 160 lb 14.4 oz (73 kg)   SpO2 96%   BMI 29.43 kg/m   Wt Readings from Last 3 Encounters:  09/28/16 160 lb 14.4 oz (73 kg)  07/13/16 158 lb 3.2 oz (71.8 kg)  03/29/16 157 lb 6.4 oz (71.4 kg)    Physical Exam  Constitutional: She appears well-developed and well-nourished.  HENT:  Mouth/Throat: Mucous membranes are normal.  Eyes: EOM are normal. No scleral icterus.  Cardiovascular: Normal rate and regular rhythm.   Pulmonary/Chest: Effort normal and breath sounds normal.  Abdominal: Bowel sounds are normal. She exhibits no distension.  Musculoskeletal: She exhibits no edema.  Neurological: She is alert.  Skin: No pallor.    Psychiatric: She has a normal mood and affect. Her behavior is normal.    Results for orders placed or performed in visit on 07/28/16  Lipid panel  Result Value Ref Range   Cholesterol, Total 243 (H) 100 - 199 mg/dL   Triglycerides 122 0 - 149 mg/dL   HDL 63 >39 mg/dL   VLDL Cholesterol Cal 24 5 - 40 mg/dL   LDL Calculated 156 (H) 0 - 99 mg/dL   Chol/HDL Ratio 3.9 0.0 - 4.4 ratio  ALT  Result Value Ref Range   ALT 12 0 - 32 IU/L      Assessment & Plan:   Problem List Items Addressed This Visit      Cardiovascular and Mediastinum   Aneurysm, cerebral, nonruptured    Stable, followed by neurologist        Digestive   GERD without esophagitis    Discussed risks of PPI; switch to H2 blocker; avoid triggers; no red flags      Relevant Medications   Probiotic Product (PROBIOTIC ADVANCED PO)   Plecanatide (TRULANCE) 3 MG TABS   ranitidine (ZANTAC) 300 MG tablet   CN (constipation)    Will try trulance for chronic idiopathic constipation; plenty of fiber, water, walking recommended        Other   Overweight (BMI 25.0-29.9)    Try to limit portions, drink more water, more active as weather cools off      Hyperlipidemia LDL goal <100 - Primary (Chronic)    Check fasting lipids after being on statinn for 6-8 weeks; advised limiting foods with saturated fats      Relevant Orders   Lipid panel   Hyperglycemia    Check A1c and glucose      Relevant Orders   Hemoglobin A1c   Encounter for medication monitoring    Monitor sgpt and Cr      Relevant Orders   Comprehensive metabolic panel    Other Visit Diagnoses    Needs flu shot       Relevant Orders   Flu Vaccine QUAD 6+ mos PF IM (Fluarix Quad PF) (Completed)       Follow up plan: Return in about 6 months (around 03/30/2017) for twenty minute follow-up with fasting labs.  An after-visit summary was printed and given to the patient at [place].  Please see the patient instructions which may contain other  information and  recommendations beyond what is mentioned above in the assessment and plan.  Meds ordered this encounter  Medications  . Probiotic Product (PROBIOTIC ADVANCED PO)    Sig: Take by mouth daily.  Marland Kitchen Plecanatide (TRULANCE) 3 MG TABS    Sig: Take 1 tablet by mouth daily. For constipation    Dispense:  30 tablet    Refill:  11  . ranitidine (ZANTAC) 300 MG tablet    Sig: Take 1 tablet (300 mg total) by mouth at bedtime. For reflux    Dispense:  90 tablet    Refill:  3    Orders Placed This Encounter  Procedures  . Flu Vaccine QUAD 6+ mos PF IM (Fluarix Quad PF)  . Comprehensive metabolic panel  . Hemoglobin A1c  . Lipid panel

## 2016-09-28 NOTE — Assessment & Plan Note (Signed)
Monitor sgpt and Cr

## 2016-09-28 NOTE — Assessment & Plan Note (Signed)
Discussed risks of PPI; switch to H2 blocker; avoid triggers; no red flags

## 2016-10-17 ENCOUNTER — Encounter: Payer: Self-pay | Admitting: Family Medicine

## 2016-10-19 MED ORDER — OMEPRAZOLE 20 MG PO CPDR: 20.0000 mg | DELAYED_RELEASE_CAPSULE | Freq: Two times a day (BID) | ORAL | 0 refills | Status: DC

## 2016-10-19 NOTE — Telephone Encounter (Signed)
I have returned from an extended time away from the office Reviewed Mychart note Left message that if symptoms not resolved, sending one month of PPI, call with issues Especially, contact us if any dark stools, sx of ulcer, etc.

## 2016-10-20 ENCOUNTER — Encounter: Payer: Self-pay | Admitting: Family Medicine

## 2016-10-20 ENCOUNTER — Telehealth: Payer: Self-pay | Admitting: Family Medicine

## 2016-10-20 NOTE — Telephone Encounter (Signed)
Pt was prescribed prilosec and the pharmacy would not fill it. Was told it would have to be through mail order. Pt would like to know if she can just get it over the counter. Requesting return call at work [phone]

## 2016-10-24 NOTE — Telephone Encounter (Signed)
She is welcome to get some over-the-counter, and I can send in Rx to mail order if she'd like

## 2016-10-25 ENCOUNTER — Telehealth: Payer: Self-pay

## 2016-10-25 MED ORDER — OMEPRAZOLE 20 MG PO CPDR: 20.0000 mg | DELAYED_RELEASE_CAPSULE | Freq: Every day | ORAL | 0 refills | Status: DC

## 2016-10-25 NOTE — Addendum Note (Signed)
Addended by: LADA, Satira Anis on: 10/25/2016 10:05 AM   Modules accepted: Orders

## 2016-10-25 NOTE — Telephone Encounter (Signed)
erroneous

## 2016-10-25 NOTE — Telephone Encounter (Signed)
Pt states it would be ok to send the Rx for Prilosec to mail order.

## 2016-10-25 NOTE — Telephone Encounter (Signed)
Left a detail messaged to give our office a call back.

## 2016-11-19 LAB — COMPREHENSIVE METABOLIC PANEL
ALBUMIN: 4.2 g/dL (ref 3.6–4.8)
ALK PHOS: 101 IU/L (ref 39–117)
ALT: 15 IU/L (ref 0–32)
AST: 20 IU/L (ref 0–40)
Albumin/Globulin Ratio: 1.8 (ref 1.2–2.2)
BILIRUBIN TOTAL: 0.2 mg/dL (ref 0.0–1.2)
BUN / CREAT RATIO: 26 (ref 12–28)
BUN: 16 mg/dL (ref 8–27)
CHLORIDE: 105 mmol/L (ref 96–106)
CO2: 24 mmol/L (ref 20–29)
Calcium: 9.3 mg/dL (ref 8.7–10.3)
Creatinine, Ser: 0.62 mg/dL (ref 0.57–1.00)
GFR calc Af Amer: 110 mL/min/{1.73_m2} (ref >59)
GFR calc non Af Amer: 96 mL/min/{1.73_m2} (ref >59)
GLOBULIN, TOTAL: 2.3 g/dL (ref 1.5–4.5)
GLUCOSE: 100 mg/dL — AB (ref 65–99)
Potassium: 5 mmol/L (ref 3.5–5.2)
SODIUM: 142 mmol/L (ref 134–144)
Total Protein: 6.5 g/dL (ref 6.0–8.5)

## 2016-11-19 LAB — LIPID PANEL
CHOL/HDL RATIO: 3.2 ratio (ref 0.0–4.4)
Cholesterol, Total: 168 mg/dL (ref 100–199)
HDL: 53 mg/dL (ref >39)
LDL Calculated: 82 mg/dL (ref 0–99)
Triglycerides: 164 mg/dL — ABNORMAL HIGH (ref 0–149)
VLDL Cholesterol Cal: 33 mg/dL (ref 5–40)

## 2016-11-19 LAB — HEMOGLOBIN A1C
Est. average glucose Bld gHb Est-mCnc: 114 mg/dL
HEMOGLOBIN A1C: 5.6 % (ref 4.8–5.6)

## 2016-11-22 ENCOUNTER — Encounter: Payer: Self-pay | Admitting: Family Medicine

## 2016-12-13 ENCOUNTER — Other Ambulatory Visit: Payer: Self-pay | Admitting: Family Medicine

## 2017-01-12 ENCOUNTER — Other Ambulatory Visit: Payer: Self-pay | Admitting: Family Medicine

## 2017-01-13 NOTE — Telephone Encounter (Signed)
Statin Rx not due until Feb 2019 PPI due soon; approved

## 2017-01-18 ENCOUNTER — Telehealth: Payer: Self-pay | Admitting: Family Medicine

## 2017-01-18 DIAGNOSIS — K219 Gastro-esophageal reflux disease without esophagitis: Secondary | ICD-10-CM

## 2017-01-18 NOTE — Telephone Encounter (Signed)
I received a note from the patient's pharmacy benefits manager They are concerned about the length of time she has been on the stomach medicine I would like to have her see a gastroenterologist I'll put in the referral, but we'll want her to see that specialist, consider an EGD to see if there is something going on that warrants attention more than just plain old acid reflux Thank you

## 2017-01-18 NOTE — Assessment & Plan Note (Signed)
Refer to GI 

## 2017-01-19 NOTE — Telephone Encounter (Signed)
Called pt informed her of the need to be seen by GI. Pt states that they have already called her to schedule. Pt states that at this time she is unable to schedule an appt because her husband is in [facility], "has had 4 surgeries the past 13 days" Advised to take care of her husband and schedule w/ GI as soon as she is able. Pt gave verbal understanding.

## 2017-03-01 ENCOUNTER — Other Ambulatory Visit: Payer: Self-pay | Admitting: Family Medicine

## 2017-03-03 ENCOUNTER — Other Ambulatory Visit: Payer: Self-pay | Admitting: Family Medicine

## 2017-03-03 NOTE — Telephone Encounter (Signed)
Last Rx sent to [facility] on 01/13/17; next should be sent around 04/11/17

## 2017-03-30 ENCOUNTER — Encounter: Payer: Self-pay | Admitting: Family Medicine

## 2017-03-30 ENCOUNTER — Ambulatory Visit: Payer: 59 | Admitting: Family Medicine

## 2017-03-30 DIAGNOSIS — E669 Obesity, unspecified: Secondary | ICD-10-CM | POA: Diagnosis not present

## 2017-03-30 DIAGNOSIS — K219 Gastro-esophageal reflux disease without esophagitis: Secondary | ICD-10-CM

## 2017-03-30 DIAGNOSIS — E785 Hyperlipidemia, unspecified: Secondary | ICD-10-CM

## 2017-03-30 DIAGNOSIS — I729 Aneurysm of unspecified site: Secondary | ICD-10-CM

## 2017-03-30 DIAGNOSIS — Z5181 Encounter for therapeutic drug level monitoring: Secondary | ICD-10-CM

## 2017-03-30 DIAGNOSIS — F330 Major depressive disorder, recurrent, mild: Secondary | ICD-10-CM

## 2017-03-30 NOTE — Assessment & Plan Note (Signed)
Limit saturated fats; continue statin; check labs on or after April 20th

## 2017-03-30 NOTE — Assessment & Plan Note (Signed)
Monitored by neurologist 

## 2017-03-30 NOTE — Assessment & Plan Note (Signed)
Check liver enzymes 

## 2017-03-30 NOTE — Assessment & Plan Note (Addendum)
Patient to see GI, elevate HOB, see GI, explained ongoing issue can lead to precancerous changes to esophagus and eventually cancer

## 2017-03-30 NOTE — Progress Notes (Signed)
BP 120/80 (BP Location: Right Arm, Patient Position: Sitting, Cuff Size: Normal)   Pulse 80   Temp 98.4 F (36.9 C) (Oral)   Resp 16   Wt 165 lb 9.6 oz (75.1 kg)   SpO2 95%   BMI 30.29 kg/m    Subjective:    Patient ID: Laura Harper, female    DOB: 1950-05-15, 67 y.o.   MRN: [id number]  HPI: Laura Harper is a 67 y.o. female  Chief Complaint  Patient presents with  . Follow-up    HPI Patient is here for f/u  She has high cholesterol; taking pravastatin; she brought her previous 156 to 41 Diet: she has not changed her diet at all; improvement is all medication; she eats pretty well already, but does "eat garbage too"; she says she really needs to do something about her diet, gained weight back Lab Results  Component Value Date   CHOL 168 11/18/2016   HDL 53 11/18/2016   LDLCALC 82 11/18/2016   TRIG 164 (H) 11/18/2016   CHOLHDL 3.2 11/18/2016   GERD; currently listed with both PPI and H2 blocker in med list, but patient says she is not taking the H2 blocker; I referred her to GI but her husband was in the hospital for 22 days and she was busy; no blood in the stool; I asked about triggers; no real triggers; it's just every night laying down to sleep she gets heartburn  Hx of elevated glucose; A1c the last two checks has been 5.6  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; June 2018; monitored by neurologist; they told her that nothing had changed; she does not take 325 mg aspirin, just 3 of the 81 mg, not 4  Mood is stable, but just needs sunshine  Depression screen [facility] 2/9 03/30/2017 09/28/2016 07/13/2016 03/29/2016 12/31/2015  Decreased Interest 0 0 0 0 1  Down, Depressed, Hopeless 0 0 0 0 1  PHQ - 2 Score 0 0 0 0 2  Altered sleeping - - - - 0  Tired, decreased energy - - - - 0  Change in appetite - - - - 0  Feeling bad or failure about yourself  - - - - 0  Trouble concentrating - - - - 2  Moving slowly or fidgety/restless - - - - 0  Suicidal  thoughts - - - - 0  PHQ-9 Score - - - - 4  Difficult doing work/chores - - - - Somewhat difficult    Relevant past medical, surgical, family and social history reviewed Past Medical History:  Diagnosis Date  . Allergy   . Brain aneurysm   . GERD (gastroesophageal reflux disease)    Past Surgical History:  Procedure Laterality Date  . COLONOSCOPY WITH PROPOFOL N/A 03/23/2015   Procedure: COLONOSCOPY WITH PROPOFOL;  Surgeon: Manya Silvas, MD;  Location: [facility] ENDOSCOPY;  Service: Endoscopy;  Laterality: N/A;  . TONSILLECTOMY Bilateral   . TUBAL LIGATION     Family History  Problem Relation Age of Onset  . Anxiety disorder Mother   . Aneurysm Mother        subarachnoid hemorrhage  . Thyroid disease Mother   . Mood Disorder Father   . Coronary artery disease Father   . Heart attack Father   . Depression Daughter   . Heart attack Brother   . Thyroid disease Sister   . Cancer Maternal Aunt        breast  . Breast cancer  Maternal Aunt 4   Social History   Tobacco Use  . Smoking status: Never Smoker  . Smokeless tobacco: Never Used  Substance Use Topics  . Alcohol use: No    Alcohol/week: 0.0 oz  . Drug use: No    Interim medical history since last visit reviewed. Allergies and medications reviewed  Review of Systems Per HPI unless specifically indicated above     Objective:    BP 120/80 (BP Location: Right Arm, Patient Position: Sitting, Cuff Size: Normal)   Pulse 80   Temp 98.4 F (36.9 C) (Oral)   Resp 16   Wt 165 lb 9.6 oz (75.1 kg)   SpO2 95%   BMI 30.29 kg/m   Wt Readings from Last 3 Encounters:  03/30/17 165 lb 9.6 oz (75.1 kg)  09/28/16 160 lb 14.4 oz (73 kg)  07/13/16 158 lb 3.2 oz (71.8 kg)    Physical Exam  Constitutional: She appears well-developed and well-nourished.  HENT:  Mouth/Throat: Mucous membranes are normal.  Eyes: EOM are normal. No scleral icterus.  Cardiovascular: Normal rate and regular rhythm.  Pulmonary/Chest: Effort  normal and breath sounds normal.  Abdominal: Bowel sounds are normal. She exhibits no distension. There is no tenderness.  Musculoskeletal: She exhibits no edema.  Neurological: She is alert.  Skin: No pallor.  Psychiatric: She has a normal mood and affect. Her behavior is normal.   Results for orders placed or performed in visit on 09/28/16  Comprehensive metabolic panel  Result Value Ref Range   Glucose 100 (H) 65 - 99 mg/dL   BUN 16 8 - 27 mg/dL   Creatinine, Ser 0.62 0.57 - 1.00 mg/dL   GFR calc non Af Amer 96 >59 mL/min/1.73   GFR calc Af Amer 110 >59 mL/min/1.73   BUN/Creatinine Ratio 26 12 - 28   Sodium 142 134 - 144 mmol/L   Potassium 5.0 3.5 - 5.2 mmol/L   Chloride 105 96 - 106 mmol/L   CO2 24 20 - 29 mmol/L   Calcium 9.3 8.7 - 10.3 mg/dL   Total Protein 6.5 6.0 - 8.5 g/dL   Albumin 4.2 3.6 - 4.8 g/dL   Globulin, Total 2.3 1.5 - 4.5 g/dL   Albumin/Globulin Ratio 1.8 1.2 - 2.2   Bilirubin Total 0.2 0.0 - 1.2 mg/dL   Alkaline Phosphatase 101 39 - 117 IU/L   AST 20 0 - 40 IU/L   ALT 15 0 - 32 IU/L  Hemoglobin A1c  Result Value Ref Range   Hgb A1c MFr Bld 5.6 4.8 - 5.6 %   Est. average glucose Bld gHb Est-mCnc 114 mg/dL  Lipid panel  Result Value Ref Range   Cholesterol, Total 168 100 - 199 mg/dL   Triglycerides 164 (H) 0 - 149 mg/dL   HDL 53 >39 mg/dL   VLDL Cholesterol Cal 33 5 - 40 mg/dL   LDL Calculated 82 0 - 99 mg/dL   Chol/HDL Ratio 3.2 0.0 - 4.4 ratio      Assessment & Plan:   Problem List Items Addressed This Visit      Cardiovascular and Mediastinum   Aneurysm ([place])    Monitored by neurologist        Digestive   GERD without esophagitis    Patient to see GI, elevate HOB, see GI, explained ongoing issue can lead to precancerous changes to esophagus and eventually cancer      Relevant Medications   Senna (CORRECTOL HERBAL TEA PO)     Other  Obesity (BMI 30.0-34.9)    Encouragement given      Major depressive disorder, recurrent episode  ([place])    Stable with all of the stress; offered counseling, patient declined; encouraged walking, sunshine      Hyperlipidemia LDL goal <100 (Chronic)    Limit saturated fats; continue statin; check labs on or after April 20th      Relevant Orders   Lipid panel   Encounter for medication monitoring    Check liver enzymes      Relevant Orders   Comprehensive metabolic panel   CBC with Differential/Platelet       Follow up plan: Return in about 6 months (around 09/27/2017) for follow-up visit with Dr. Sanda Klein.  An after-visit summary was printed and given to the patient at [place].  Please see the patient instructions which may contain other information and recommendations beyond what is mentioned above in the assessment and plan.  No orders of the defined types were placed in this encounter.   Orders Placed This Encounter  Procedures  . Comprehensive metabolic panel  . CBC with Differential/Platelet  . Lipid panel

## 2017-03-30 NOTE — Assessment & Plan Note (Signed)
Stable with all of the stress; offered counseling, patient declined; encouraged walking, sunshine

## 2017-03-30 NOTE — Patient Instructions (Addendum)
Please do follow-up with neurologist about what is your next step Please have our referral coordinator help you with name and number of GI doctor Caution: prolonged use of proton pump inhibitors like omeprazole (Prilosec), pantoprazole (Protonix), esomeprazole (Nexium), and others like Dexilant and Aciphex may increase your risk of pneumonia, Clostridium difficile colitis, osteoporosis, anemia and other health complications Try to limit or avoid triggers like coffee, caffeinated beverages, onions, chocolate, spicy foods, peppermint, acidic foods like pizza, spaghetti sauce, and orange juice Lose weight if you are overweight or obese Try elevating the head of your bed by placing a small wedge between your mattress and box springs to keep acid in the stomach at night instead of coming up into your esophagus Try to limit saturated fats in your diet (bologna, hot dogs, barbeque, cheeseburgers, hamburgers, steak, bacon, sausage, cheese, etc.) and get more fresh fruits, vegetables, and whole grains Check out the information at familydoctor.org entitled "Nutrition for Weight Loss: What You Need to Know about Fad Diets" Try to lose between 1-2 pounds per week by taking in fewer calories and burning off more calories You can succeed by limiting portions, limiting foods dense in calories and fat, becoming more active, and drinking 8 glasses of water a day (64 ounces) Don't skip meals, especially breakfast, as skipping meals may alter your metabolism Do not use over-the-counter weight loss pills or gimmicks that claim rapid weight loss A healthy BMI (or body mass index) is between 18.5 and 24.9 You can calculate your ideal BMI at the [place] website [employer]fr

## 2017-03-30 NOTE — Assessment & Plan Note (Signed)
Encouragement given 

## 2017-05-12 ENCOUNTER — Other Ambulatory Visit: Payer: Self-pay | Admitting: Family Medicine

## 2017-05-24 LAB — LIPID PANEL
Chol/HDL Ratio: 3.5 ratio (ref 0.0–4.4)
Cholesterol, Total: 195 mg/dL (ref 100–199)
HDL: 56 mg/dL (ref >39)
LDL Calculated: 108 mg/dL — ABNORMAL HIGH (ref 0–99)
TRIGLYCERIDES: 154 mg/dL — AB (ref 0–149)
VLDL CHOLESTEROL CAL: 31 mg/dL (ref 5–40)

## 2017-05-24 LAB — CBC WITH DIFFERENTIAL/PLATELET
BASOS: 1 %
Basophils Absolute: 0 10*3/uL (ref 0.0–0.2)
EOS (ABSOLUTE): 0.1 10*3/uL (ref 0.0–0.4)
EOS: 2 %
Hematocrit: 36.8 % (ref 34.0–46.6)
Hemoglobin: 12.8 g/dL (ref 11.1–15.9)
IMMATURE GRANS (ABS): 0 10*3/uL (ref 0.0–0.1)
IMMATURE GRANULOCYTES: 0 %
LYMPHS: 37 %
Lymphocytes Absolute: 2.2 10*3/uL (ref 0.7–3.1)
MCH: 31.4 pg (ref 26.6–33.0)
MCHC: 34.8 g/dL (ref 31.5–35.7)
MCV: 90 fL (ref 79–97)
Monocytes Absolute: 0.4 10*3/uL (ref 0.1–0.9)
Monocytes: 8 %
NEUTROS PCT: 52 %
Neutrophils Absolute: 3.1 10*3/uL (ref 1.4–7.0)
PLATELETS: 274 10*3/uL (ref 150–379)
RBC: 4.08 x10E6/uL (ref 3.77–5.28)
RDW: 13.4 % (ref 12.3–15.4)
WBC: 5.9 10*3/uL (ref 3.4–10.8)

## 2017-05-24 LAB — COMPREHENSIVE METABOLIC PANEL
ALT: 15 IU/L (ref 0–32)
AST: 22 IU/L (ref 0–40)
Albumin/Globulin Ratio: 2.3 — ABNORMAL HIGH (ref 1.2–2.2)
Albumin: 4.5 g/dL (ref 3.6–4.8)
Alkaline Phosphatase: 98 IU/L (ref 39–117)
BUN/Creatinine Ratio: 26 (ref 12–28)
BUN: 19 mg/dL (ref 8–27)
Bilirubin Total: 0.4 mg/dL (ref 0.0–1.2)
CALCIUM: 9.6 mg/dL (ref 8.7–10.3)
CO2: 24 mmol/L (ref 20–29)
CREATININE: 0.74 mg/dL (ref 0.57–1.00)
Chloride: 103 mmol/L (ref 96–106)
GFR calc Af Amer: 99 mL/min/{1.73_m2} (ref >59)
GFR calc non Af Amer: 86 mL/min/{1.73_m2} (ref >59)
GLUCOSE: 105 mg/dL — AB (ref 65–99)
Globulin, Total: 2 g/dL (ref 1.5–4.5)
Potassium: 4.8 mmol/L (ref 3.5–5.2)
Sodium: 141 mmol/L (ref 134–144)
Total Protein: 6.5 g/dL (ref 6.0–8.5)

## 2017-05-28 ENCOUNTER — Encounter: Payer: Self-pay | Admitting: Family Medicine

## 2017-08-05 ENCOUNTER — Other Ambulatory Visit: Payer: Self-pay | Admitting: Family Medicine

## 2017-08-07 NOTE — Telephone Encounter (Signed)
Lab Results  Component Value Date   CHOL 195 05/23/2017   HDL 56 05/23/2017   LDLCALC 108 (H) 05/23/2017   TRIG 154 (H) 05/23/2017   CHOLHDL 3.5 05/23/2017

## 2017-08-23 ENCOUNTER — Encounter: Payer: Self-pay | Admitting: Family Medicine

## 2017-09-27 ENCOUNTER — Ambulatory Visit: Payer: Managed Care, Other (non HMO) | Admitting: Nurse Practitioner

## 2017-10-10 ENCOUNTER — Encounter: Payer: Self-pay | Admitting: Family Medicine

## 2017-10-10 ENCOUNTER — Ambulatory Visit: Payer: Managed Care, Other (non HMO) | Admitting: Family Medicine

## 2017-10-10 VITALS — BP 124/78 | HR 86 | Temp 98.4°F | Ht 62.0 in | Wt 158.7 lb

## 2017-10-10 DIAGNOSIS — I729 Aneurysm of unspecified site: Secondary | ICD-10-CM

## 2017-10-10 DIAGNOSIS — F4321 Adjustment disorder with depressed mood: Secondary | ICD-10-CM | POA: Diagnosis not present

## 2017-10-10 DIAGNOSIS — R739 Hyperglycemia, unspecified: Secondary | ICD-10-CM

## 2017-10-10 DIAGNOSIS — K219 Gastro-esophageal reflux disease without esophagitis: Secondary | ICD-10-CM | POA: Diagnosis not present

## 2017-10-10 DIAGNOSIS — Z1239 Encounter for other screening for malignant neoplasm of breast: Secondary | ICD-10-CM

## 2017-10-10 DIAGNOSIS — Z1231 Encounter for screening mammogram for malignant neoplasm of breast: Secondary | ICD-10-CM

## 2017-10-10 DIAGNOSIS — F330 Major depressive disorder, recurrent, mild: Secondary | ICD-10-CM

## 2017-10-10 DIAGNOSIS — Z5181 Encounter for therapeutic drug level monitoring: Secondary | ICD-10-CM

## 2017-10-10 DIAGNOSIS — E785 Hyperlipidemia, unspecified: Secondary | ICD-10-CM | POA: Diagnosis not present

## 2017-10-10 DIAGNOSIS — Z23 Encounter for immunization: Secondary | ICD-10-CM | POA: Diagnosis not present

## 2017-10-10 NOTE — Patient Instructions (Addendum)
Please do call to schedule your mammogram; the number to schedule one at either [facility] or [facility] is [phone] Continue CoQ10 Try 4 ounces of tonic water every evening  Call me if still having leg symptoms Have fasting labs done on or after October 24th Call if you have not heard from me one week after your blood draw

## 2017-10-10 NOTE — Progress Notes (Signed)
BP 124/78   Pulse 86   Temp 98.4 F (36.9 C) (Oral)   Ht 5\' 2"  (1.575 m)   Wt 158 lb 11.2 oz (72 kg)   SpO2 98%   BMI 29.03 kg/m    Subjective:    Patient ID: Laura Harper, female    DOB: 02-03-1950, 67 y.o.   MRN: [id number]  HPI: Laura Harper is a 67 y.o. female  Chief Complaint  Patient presents with  . Follow-up    6 months, husband passed away    HPI  Patient is here for f/u; since last visit, her husband died and most of our visit was time recalling his final days, her reaction to his illness and passing, and how she has been coping since  GERD: rx prilosec 20mg  capsule; helps well. Had to have endoscopy but got cancelled due to husband illness; doesn't have any vacation time. She'll call them to get that done  Aneurysm: states was following with Dr. Melrose Nakayama, but would like new provider. 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  Hyperlipidemia: pravastatin 40mg  nightly; did have some discomfort from the statin at first, but that went away; just restless legs at time; not all the time, does not think it is the cholesterol medicine; wakes her up at time; the legs have always given her a little bit of trouble, even before the chol medicine; she is trying walking Lab Results  Component Value Date   CHOL 195 05/23/2017   HDL 56 05/23/2017   LDLCALC 108 (H) 05/23/2017   TRIG 154 (H) 05/23/2017   CHOLHDL 3.5 05/23/2017   States had recent biometric screening recently; okay with having it redrawn  Depression: zoloft 100mg  daily. States think this dosage is fine the way it is even though she is grieving Husband passed away in 10/07/2022, States most of the time can keep it together, but today was given his vest from his job and [employer] and that was difficulty. 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 Plans to stay with daughter in [place] for a week around [place].   Depression screen [facility] 2/9 10/10/2017 10/10/2017  03/30/2017 09/28/2016 07/13/2016  Decreased Interest 0 0 0 0 0  Down, Depressed, Hopeless 1 1 0 0 0  PHQ - 2 Score 1 1 0 0 0  Altered sleeping 3 - - - -  Tired, decreased energy 1 - - - -  Change in appetite 0 - - - -  Feeling bad or failure about yourself  0 - - - -  Trouble concentrating 1 - - - -  Moving slowly or fidgety/restless 0 - - - -  Suicidal thoughts 0 - - - -  PHQ-9 Score 6 - - - -  Difficult doing work/chores Somewhat difficult - - - -  MD note: no SI/HI  Relevant past medical, surgical, family and social history reviewed Past Medical History:  Diagnosis Date  . Allergy   . Brain aneurysm   . GERD (gastroesophageal reflux disease)    Past Surgical History:  Procedure Laterality Date  . COLONOSCOPY WITH PROPOFOL N/A 03/23/2015   Procedure: COLONOSCOPY WITH PROPOFOL;  Surgeon: Manya Silvas, MD;  Location[facility] ENDOSCOPY;  Service: Endoscopy;  Laterality: N/A;  . TONSILLECTOMY Bilateral   . TUBAL LIGATION     Family History  Problem Relation Age of Onset  . Anxiety disorder Mother   . Aneurysm Mother  subarachnoid hemorrhage  . Thyroid disease Mother   . Mood Disorder Father   . Coronary artery disease Father   . Heart attack Father   . Depression Daughter   . Heart attack Brother   . Thyroid disease Sister   . Cancer Maternal Aunt        breast  . Breast cancer Maternal Aunt 24   Social History   Tobacco Use  . Smoking status: Never Smoker  . Smokeless tobacco: Never Used  Substance Use Topics  . Alcohol use: No    Alcohol/week: 0.0 standard drinks  . Drug use: No    Interim medical history since last visit reviewed. Allergies and medications reviewed  Review of Systems Per HPI unless specifically indicated above     Objective:    BP 124/78   Pulse 86   Temp 98.4 F (36.9 C) (Oral)   Ht 5\' 2"  (1.575 m)   Wt 158 lb 11.2 oz (72 kg)   SpO2 98%   BMI 29.03 kg/m   Wt Readings from Last 3 Encounters:  10/10/17 158 lb 11.2 oz (72  kg)  03/30/17 165 lb 9.6 oz (75.1 kg)  09/28/16 160 lb 14.4 oz (73 kg)    Physical Exam  Constitutional: She appears well-developed and well-nourished. No distress.  HENT:  Head: Normocephalic and atraumatic.  Eyes: EOM are normal. No scleral icterus.  Neck: No thyromegaly present.  Cardiovascular: Normal rate, regular rhythm and normal heart sounds.  No murmur heard. Pulmonary/Chest: Effort normal and breath sounds normal. No respiratory distress. She has no wheezes.  Abdominal: Soft. Bowel sounds are normal. She exhibits no distension.  Musculoskeletal: She exhibits no edema.  Neurological: She is alert.  Skin: Skin is warm and dry. She is not diaphoretic. No pallor.  Psychiatric: Judgment and thought content normal. She is not agitated and not slowed. She expresses no homicidal and no suicidal ideation.  Tearful when discussing her husband's passing; appropriate with examiner; good eye contact; good hygiene    Results for orders placed or performed in visit on 03/30/17  Comprehensive metabolic panel  Result Value Ref Range   Glucose 105 (H) 65 - 99 mg/dL   BUN 19 8 - 27 mg/dL   Creatinine, Ser 0.74 0.57 - 1.00 mg/dL   GFR calc non Af Amer 86 >59 mL/min/1.73   GFR calc Af Amer 99 >59 mL/min/1.73   BUN/Creatinine Ratio 26 12 - 28   Sodium 141 134 - 144 mmol/L   Potassium 4.8 3.5 - 5.2 mmol/L   Chloride 103 96 - 106 mmol/L   CO2 24 20 - 29 mmol/L   Calcium 9.6 8.7 - 10.3 mg/dL   Total Protein 6.5 6.0 - 8.5 g/dL   Albumin 4.5 3.6 - 4.8 g/dL   Globulin, Total 2.0 1.5 - 4.5 g/dL   Albumin/Globulin Ratio 2.3 (H) 1.2 - 2.2   Bilirubin Total 0.4 0.0 - 1.2 mg/dL   Alkaline Phosphatase 98 39 - 117 IU/L   AST 22 0 - 40 IU/L   ALT 15 0 - 32 IU/L  CBC with Differential/Platelet  Result Value Ref Range   WBC 5.9 3.4 - 10.8 x10E3/uL   RBC 4.08 3.77 - 5.28 x10E6/uL   Hemoglobin 12.8 11.1 - 15.9 g/dL   Hematocrit 36.8 34.0 - 46.6 %   MCV 90 79 - 97 fL   MCH 31.4 26.6 - 33.0 pg    MCHC 34.8 31.5 - 35.7 g/dL   RDW 13.4 12.3 - 15.4 %  Platelets 274 150 - 379 x10E3/uL   Neutrophils 52 Not Estab. %   Lymphs 37 Not Estab. %   Monocytes 8 Not Estab. %   Eos 2 Not Estab. %   Basos 1 Not Estab. %   Neutrophils Absolute 3.1 1.4 - 7.0 x10E3/uL   Lymphocytes Absolute 2.2 0.7 - 3.1 x10E3/uL   Monocytes Absolute 0.4 0.1 - 0.9 x10E3/uL   EOS (ABSOLUTE) 0.1 0.0 - 0.4 x10E3/uL   Basophils Absolute 0.0 0.0 - 0.2 x10E3/uL   Immature Granulocytes 0 Not Estab. %   Immature Grans (Abs) 0.0 0.0 - 0.1 x10E3/uL  Lipid panel  Result Value Ref Range   Cholesterol, Total 195 100 - 199 mg/dL   Triglycerides 154 (H) 0 - 149 mg/dL   HDL 56 >39 mg/dL   VLDL Cholesterol Cal 31 5 - 40 mg/dL   LDL Calculated 108 (H) 0 - 99 mg/dL   Chol/HDL Ratio 3.5 0.0 - 4.4 ratio      Assessment & Plan:   Problem List Items Addressed This Visit      Cardiovascular and Mediastinum   Aneurysm ([place])   Relevant Orders   Ambulatory referral to Neurology     Digestive   GERD without esophagitis     Other   Hyperglycemia   Relevant Orders   Hemoglobin A1c   Major depressive disorder, recurrent episode ([place])   Hyperlipidemia LDL goal <100 (Chronic)   Relevant Orders   Lipid panel   Encounter for medication monitoring   Relevant Orders   CBC with Differential/Platelet   Comprehensive metabolic panel    Other Visit Diagnoses    Grief reaction    -  Primary   supportive listening; recommended Hospice grief counseling; she is working with her pastor; she'll continue SSRI   Need for influenza vaccination       Relevant Orders   Flu Vaccine QUAD 6+ mos PF IM (Fluarix Quad PF) (Completed)   Screening for breast cancer       Relevant Orders   MM 3D SCREEN BREAST BILATERAL       Follow up plan: Return in about 6 weeks (around 11/23/2017) for fasting labs only; return to see Dr. Sanda Klein in 6 months.  An after-visit summary was printed and given to the patient at [place].  Please see the patient  instructions which may contain other information and recommendations beyond what is mentioned above in the assessment and plan.  No orders of the defined types were placed in this encounter.   Orders Placed This Encounter  Procedures  . MM 3D SCREEN BREAST BILATERAL  . Flu Vaccine QUAD 6+ mos PF IM (Fluarix Quad PF)  . CBC with Differential/Platelet  . Lipid panel  . Hemoglobin A1c  . Comprehensive metabolic panel  . Ambulatory referral to Neurology

## 2017-10-24 ENCOUNTER — Ambulatory Visit
Admission: RE | Admit: 2017-10-24 | Discharge: 2017-10-24 | Disposition: A | Discharge status: Home / Self Care | Payer: Managed Care, Other (non HMO) | Source: Ambulatory Visit | Attending: Family Medicine | Admitting: Family Medicine

## 2017-10-24 DIAGNOSIS — Z1239 Encounter for other screening for malignant neoplasm of breast: Secondary | ICD-10-CM

## 2017-10-24 DIAGNOSIS — Z1231 Encounter for screening mammogram for malignant neoplasm of breast: Secondary | ICD-10-CM | POA: Diagnosis not present

## 2017-10-25 ENCOUNTER — Other Ambulatory Visit: Payer: Self-pay | Admitting: Family Medicine

## 2017-10-25 DIAGNOSIS — N6310 Unspecified lump in the right breast, unspecified quadrant: Secondary | ICD-10-CM

## 2017-10-25 DIAGNOSIS — R928 Other abnormal and inconclusive findings on diagnostic imaging of breast: Secondary | ICD-10-CM

## 2017-10-30 ENCOUNTER — Ambulatory Visit
Admission: RE | Admit: 2017-10-30 | Discharge: 2017-10-30 | Disposition: A | Discharge status: Home / Self Care | Payer: Managed Care, Other (non HMO) | Source: Ambulatory Visit | Attending: Family Medicine | Admitting: Family Medicine

## 2017-10-30 DIAGNOSIS — R928 Other abnormal and inconclusive findings on diagnostic imaging of breast: Secondary | ICD-10-CM

## 2017-10-30 DIAGNOSIS — N6310 Unspecified lump in the right breast, unspecified quadrant: Secondary | ICD-10-CM

## 2017-12-19 LAB — CBC WITH DIFFERENTIAL/PLATELET
BASOS ABS: 0 10*3/uL (ref 0.0–0.2)
Basos: 1 %
EOS (ABSOLUTE): 0.1 10*3/uL (ref 0.0–0.4)
EOS: 1 %
HEMATOCRIT: 34.4 % (ref 34.0–46.6)
HEMOGLOBIN: 11.8 g/dL (ref 11.1–15.9)
IMMATURE GRANULOCYTES: 0 %
Immature Grans (Abs): 0 10*3/uL (ref 0.0–0.1)
LYMPHS: 38 %
Lymphocytes Absolute: 2.1 10*3/uL (ref 0.7–3.1)
MCH: 31.6 pg (ref 26.6–33.0)
MCHC: 34.3 g/dL (ref 31.5–35.7)
MCV: 92 fL (ref 79–97)
MONOCYTES: 7 %
Monocytes Absolute: 0.4 10*3/uL (ref 0.1–0.9)
NEUTROS PCT: 53 %
Neutrophils Absolute: 2.9 10*3/uL (ref 1.4–7.0)
Platelets: 273 10*3/uL (ref 150–450)
RBC: 3.74 x10E6/uL — ABNORMAL LOW (ref 3.77–5.28)
RDW: 12.7 % (ref 12.3–15.4)
WBC: 5.4 10*3/uL (ref 3.4–10.8)

## 2017-12-19 LAB — COMPREHENSIVE METABOLIC PANEL
A/G RATIO: 2 (ref 1.2–2.2)
ALT: 15 IU/L (ref 0–32)
AST: 19 IU/L (ref 0–40)
Albumin: 4.3 g/dL (ref 3.6–4.8)
Alkaline Phosphatase: 102 IU/L (ref 39–117)
BUN/Creatinine Ratio: 26 (ref 12–28)
BUN: 19 mg/dL (ref 8–27)
Bilirubin Total: 0.2 mg/dL (ref 0.0–1.2)
CALCIUM: 9.1 mg/dL (ref 8.7–10.3)
CO2: 23 mmol/L (ref 20–29)
Chloride: 103 mmol/L (ref 96–106)
Creatinine, Ser: 0.73 mg/dL (ref 0.57–1.00)
GFR calc Af Amer: 100 mL/min/{1.73_m2} (ref >59)
GFR, EST NON AFRICAN AMERICAN: 87 mL/min/{1.73_m2} (ref >59)
GLUCOSE: 97 mg/dL (ref 65–99)
Globulin, Total: 2.1 g/dL (ref 1.5–4.5)
Potassium: 4.7 mmol/L (ref 3.5–5.2)
Sodium: 140 mmol/L (ref 134–144)
Total Protein: 6.4 g/dL (ref 6.0–8.5)

## 2017-12-19 LAB — LIPID PANEL
CHOL/HDL RATIO: 3.1 ratio (ref 0.0–4.4)
Cholesterol, Total: 177 mg/dL (ref 100–199)
HDL: 57 mg/dL (ref >39)
LDL Calculated: 98 mg/dL (ref 0–99)
Triglycerides: 109 mg/dL (ref 0–149)
VLDL CHOLESTEROL CAL: 22 mg/dL (ref 5–40)

## 2017-12-19 LAB — HEMOGLOBIN A1C
Est. average glucose Bld gHb Est-mCnc: 117 mg/dL
Hgb A1c MFr Bld: 5.7 % — ABNORMAL HIGH (ref 4.8–5.6)

## 2017-12-21 ENCOUNTER — Encounter: Payer: Self-pay | Admitting: Family Medicine

## 2017-12-21 NOTE — Progress Notes (Signed)
Greetings. Thank you for having your labs done. Your white blood cell count is normal. Your red blood cells are just a few hundredths of a point below normal. You've brought your LDL cholesterol down 10 points. Are you doing everything you can in regards to your diet and activity level? If so, please let me know if I may increase your cholesterol medicine. Your 3 month blood sugar average is in the prediabetes range, so weight management, activity, and healthy eating are key. Your other labs look great. Please feel free to write or call with any questions or concerns. Peace, Dr. Sanda Klein

## 2017-12-25 ENCOUNTER — Other Ambulatory Visit: Payer: Self-pay | Admitting: Family Medicine

## 2018-01-10 ENCOUNTER — Other Ambulatory Visit: Payer: Self-pay | Admitting: Family Medicine

## 2018-01-25 ENCOUNTER — Other Ambulatory Visit: Payer: Self-pay | Admitting: Family Medicine

## 2018-02-07 ENCOUNTER — Encounter: Payer: Self-pay | Admitting: Emergency Medicine

## 2018-02-07 ENCOUNTER — Emergency Department
Admission: EM | Admit: 2018-02-07 | Discharge: 2018-02-07 | Disposition: A | Discharge status: Home / Self Care | Payer: Managed Care, Other (non HMO) | Attending: Emergency Medicine | Admitting: Emergency Medicine

## 2018-02-07 ENCOUNTER — Other Ambulatory Visit: Payer: Self-pay

## 2018-02-07 ENCOUNTER — Emergency Department: Payer: Managed Care, Other (non HMO)

## 2018-02-07 DIAGNOSIS — Z7982 Long term (current) use of aspirin: Secondary | ICD-10-CM | POA: Diagnosis not present

## 2018-02-07 DIAGNOSIS — W01198A Fall on same level from slipping, tripping and stumbling with subsequent striking against other object, initial encounter: Secondary | ICD-10-CM | POA: Insufficient documentation

## 2018-02-07 DIAGNOSIS — M542 Cervicalgia: Secondary | ICD-10-CM | POA: Insufficient documentation

## 2018-02-07 DIAGNOSIS — Z79899 Other long term (current) drug therapy: Secondary | ICD-10-CM | POA: Insufficient documentation

## 2018-02-07 DIAGNOSIS — Y999 Unspecified external cause status: Secondary | ICD-10-CM | POA: Insufficient documentation

## 2018-02-07 DIAGNOSIS — Y9389 Activity, other specified: Secondary | ICD-10-CM | POA: Diagnosis not present

## 2018-02-07 DIAGNOSIS — S0990XA Unspecified injury of head, initial encounter: Secondary | ICD-10-CM | POA: Insufficient documentation

## 2018-02-07 DIAGNOSIS — M7918 Myalgia, other site: Secondary | ICD-10-CM | POA: Insufficient documentation

## 2018-02-07 DIAGNOSIS — Y9289 Other specified places as the place of occurrence of the external cause: Secondary | ICD-10-CM | POA: Diagnosis not present

## 2018-02-07 NOTE — Discharge Instructions (Signed)
Please take ibuprofen every 6 hours and tylenol if needed for pain.  Follow up with primary care for symptoms that are not improving over the week.  Return to the ER for symptoms that change or worsen if unable to schedule an appointment.

## 2018-02-07 NOTE — ED Provider Notes (Signed)
[facility] Emergency Department Provider Note ____________________________________________  Time seen: Approximately 3:35 PM  I have reviewed the triage vital signs and the nursing notes.   HISTORY  Chief Complaint Fall    HPI Laura Harper is a 68 y.o. female who presents to the emergency department for evaluation and treatment of headache, right shoulder, neck and back pain after a mechanical, non syncopal fall last night while walking down a wheelchair ramp.She landed on her back and right side. She had an episode of blurred vision for about an hour earlier today. That has since resolved. Pain is worse with movement and relieved by rest.   Past Medical History:  Diagnosis Date  . Allergy   . Brain aneurysm   . GERD (gastroesophageal reflux disease)     Patient Active Problem List   Diagnosis Date Noted  . Chronic left-sided back pain 07/13/2016  . Aneurysm ([place]) 04/25/2016  . Hyperglycemia 03/29/2016  . Obesity (BMI 30.0-34.9) 11/26/2015  . Abnormal mammogram of right breast 11/26/2015  . Stress due to illness of family member 11/26/2015  . Hyperlipidemia LDL goal <100 10/01/2015  . Encounter for medication monitoring 10/01/2015  . Annual physical exam 04/13/2015  . Encounter for screening mammogram for malignant neoplasm of breast 04/13/2015  . Brittle nails 04/13/2015  . Screening for STD (sexually transmitted disease) 04/13/2015  . Tubular adenoma of colon 03/24/2015  . Allergic state 01/13/2015  . CN (constipation) 01/13/2015  . GERD without esophagitis 01/13/2015  . Major depressive disorder, recurrent episode ([place]) 01/13/2015  . Aneurysm, cerebral, nonruptured 06/14/2012  . 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 COLONOSCOPY WITH PROPOFOL N/A 03/23/2015   Procedure: COLONOSCOPY WITH PROPOFOL;  Surgeon: Manya Silvas, MD;  [facility] ENDOSCOPY;  Service: Endoscopy;   Laterality: N/A;  . TONSILLECTOMY Bilateral   . TUBAL LIGATION      Prior to Admission medications   Medication Sig Start Date End Date Taking? Authorizing Provider  aspirin 325 MG EC tablet Take 81 mg by mouth daily.     [provider]  fluticasone (FLONASE) 50 MCG/ACT nasal spray SHAKE LQ AND U 1 SPR IEN BID 03/07/17   [provider]  omeprazole (PRILOSEC) 20 MG capsule TAKE 1 CAPSULE BY MOUTH  DAILY 01/13/17   Arnetha Courser, MD  pravastatin (PRAVACHOL) 40 MG tablet TAKE 1 TABLET BY MOUTH AT  BEDTIME 01/25/18   Lada, Satira Anis, MD  Probiotic Product (PROBIOTIC ADVANCED PO) Take by mouth daily.    [provider]  Senna (CORRECTOL HERBAL TEA PO) Take by mouth.    [provider]  sertraline (ZOLOFT) 100 MG tablet TAKE 1 TABLET BY MOUTH  DAILY 05/13/17   Lada, Satira Anis, MD    Allergies Patient has no known allergies.  Family History  Problem Relation Age of Onset  . Anxiety disorder Mother   . Aneurysm Mother        subarachnoid hemorrhage  . Thyroid disease Mother   . Mood Disorder Father   . Coronary artery disease Father   . Heart attack Father   . Depression Daughter   . Heart attack Brother   . Thyroid disease Sister   . Cancer Maternal Aunt        breast  . Breast cancer Maternal Aunt 64    Social History Social History   Tobacco Use  . Smoking status: Never Smoker  . Smokeless tobacco: Never  Used  Substance Use Topics  . Alcohol use: No    Alcohol/week: 0.0 standard drinks  . Drug use: No    Review of Systems Constitutional: Negative for fever. HEENT: Positive for a 1 hour episode of blurred vision. Cardiovascular: Negative for chest pain. Respiratory: Negative for shortness of breath. Musculoskeletal: Positive for neck, right shoulder, and bilateral elbow pain. Skin: Negative for open wounds or lesions.  Neurological: Negative for decrease in sensation  ____________________________________________   PHYSICAL  EXAM:  VITAL SIGNS: ED Triage Vitals [02/07/18 1415]  Enc Vitals Group     BP 129/61     Pulse Rate 64     Resp 16     Temp 98.3 F (36.8 C)     Temp Source Oral     SpO2 98 %     Weight 160 lb (72.6 kg)     Height 5\' 2"  (1.575 m)     Head Circumference      Peak Flow      Pain Score 5     Pain Loc      Pain Edu?      Excl. in [place]?     Constitutional: Alert and oriented. Well appearing and in no acute distress. Eyes: Conjunctivae are clear without discharge or drainage Head: Atraumatic Neck: Supple. No midline tenderness over the cervical spine. Paracervical tenderness.  Respiratory: No cough. Respirations are even and unlabored. Musculoskeletal: FROM of extremities. Neck pain increases with flexion. Neurologic: Awake, alert, oriented x 4.  Skin: Intact on exposed skin surface.  Psychiatric: Affect and behavior are appropriate.  ____________________________________________   LABS (all labs ordered are listed, but only abnormal results are displayed)  Labs Reviewed - No data to display ____________________________________________  RADIOLOGY  CT of the head and cervical spine is negative per radiology.  ____________________________________________   PROCEDURES  Procedures  ____________________________________________   INITIAL IMPRESSION / ASSESSMENT AND PLAN / ED COURSE  Laura Harper is a 68 y.o. who presents to the emergency department for treatment and evaluation after a mechanical fall about 2am. Exam and CT is reassuring. She prefers to take ibuprofen and tylenol.   Patient instructed to follow-up with primary care if not improving over the week.  She was also instructed to return to the emergency department for symptoms that change or worsen if unable schedule an appointment with orthopedics or primary care.  Medications - No data to display  Pertinent labs & imaging results that were available during my care of the patient were reviewed by me and  considered in my medical decision making (see chart for details).  _________________________________________   FINAL CLINICAL IMPRESSION(S) / ED DIAGNOSES  Final diagnoses:  Minor head injury, initial encounter  Musculoskeletal pain    ED Discharge Orders    None       If controlled substance prescribed during this visit, 12 month history viewed on the [place] prior to issuing an initial prescription for Schedule II or III opiod.    Victorino Dike, FNP 02/07/18 1621    Schuyler Amor, MD 02/07/18 1743

## 2018-02-07 NOTE — ED Notes (Signed)
Pt is being discharged to home. Pt is AOx4, VSS, she does not show any signs of distress and only c/o mild head discomfort. AVS was given and explained to the pt and she verbalized understanding of the AVS and the discharge plan of care.

## 2018-02-07 NOTE — ED Triage Notes (Signed)
Pt to ED from home c/o fall last night slipped while walking down handicap ramp with frost on it, landed on back and head hit wooden ramp, denies LOC, denies blood thinners, states dull headache now, right shoulder neck and back pain.  No pain upon palpation down spine.  Pt A&Ox4, speaking in complete and coherent sentences, chest rise even and unlabored, skin warm and dry, strength equal and strong bilaterally, sensation intact.

## 2018-03-14 ENCOUNTER — Other Ambulatory Visit: Payer: Self-pay | Admitting: Family Medicine

## 2018-03-15 NOTE — Telephone Encounter (Signed)
This sertraline is being requested a full two months early Even with mail order, if I wrote the script on 05/13/2017, it should still take take for that one to have been mailed to her, so I expect even that didn't get started until 4/202019 or even 05/27/2017 Please contact patient and make sure she is taking it as directed; has she increased her own dose? Taking more than prescribed? Just want to make sure she's doing okay If I need to see her to talk about dose adjustment, please put her on the schedule

## 2018-03-16 NOTE — Telephone Encounter (Signed)
Pt returned [place] call. Pt says that she didn't and does not need a refill on Sertraline. Pt says that she think the pharmacy sent request on there own.

## 2018-03-16 NOTE — Telephone Encounter (Signed)
Left detailed VM. CRM created.  

## 2018-03-16 NOTE — Telephone Encounter (Signed)
Left voice mail

## 2018-03-30 ENCOUNTER — Other Ambulatory Visit: Payer: Self-pay | Admitting: Family Medicine

## 2018-04-02 ENCOUNTER — Ambulatory Visit: Payer: Managed Care, Other (non HMO) | Admitting: Family Medicine

## 2018-04-02 ENCOUNTER — Encounter: Payer: Self-pay | Admitting: Family Medicine

## 2018-04-02 VITALS — BP 120/72 | HR 86 | Temp 98.1°F | Ht 62.0 in | Wt 161.1 lb

## 2018-04-02 DIAGNOSIS — Z1239 Encounter for other screening for malignant neoplasm of breast: Secondary | ICD-10-CM

## 2018-04-02 DIAGNOSIS — E2839 Other primary ovarian failure: Secondary | ICD-10-CM

## 2018-04-02 DIAGNOSIS — E785 Hyperlipidemia, unspecified: Secondary | ICD-10-CM

## 2018-04-02 DIAGNOSIS — Z5181 Encounter for therapeutic drug level monitoring: Secondary | ICD-10-CM

## 2018-04-02 DIAGNOSIS — R7303 Prediabetes: Secondary | ICD-10-CM

## 2018-04-02 DIAGNOSIS — R0683 Snoring: Secondary | ICD-10-CM

## 2018-04-02 DIAGNOSIS — E663 Overweight: Secondary | ICD-10-CM

## 2018-04-02 DIAGNOSIS — R400 Somnolence: Secondary | ICD-10-CM

## 2018-04-02 DIAGNOSIS — R252 Cramp and spasm: Secondary | ICD-10-CM

## 2018-04-02 NOTE — Patient Instructions (Signed)
I have to [place] you to NOT drive or get behind the wheel of a car if you are falling asleep; you could kill yourself or someone else Please be safe and have someone else drive you until this is worked up and you are cleared to drive by a neurologist

## 2018-04-02 NOTE — Progress Notes (Signed)
BP 120/72   Pulse 86   Temp 98.1 F (36.7 C)   Ht 5' 2"  (1.575 m)   Wt 161 lb 1.6 oz (73.1 kg)   SpO2 98%   BMI 29.47 kg/m    Subjective:    Patient ID: Laura Harper, female    DOB: 30-Aug-1950, 68 y.o.   MRN: [id number]  HPI: Laura Harper is a 68 y.o. female  Chief Complaint  Patient presents with  . Follow-up  . Sleeping Problem    Onset about 3 months ago. Feels like she is falling asleep all of the time. Falls asleep while at work and has found herselt waking up at stop lights. Sleeping from 10pm-5:30 every night.    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  Lot of caffeine; coffee does not work; cold caffeine like Coke does help; that will keep her up No head injuries; no hx of stroke; however, I found a closed head injury in the note; cracked head, did MRI; however, symptoms started before the head injury Mother and father both snore, no sleep d/o No family hx of seizure d/o   High cholesterol; expects to be worse with weight gain and eating issues, "I expect it to be really bad"; LDL 98  She has gained weight; sitting on her tail at work all day long; eating like typical American; bored out of her mind at work, snacks all day at work Mother and sister with thyroid disease  Prediabetes; A1c right at 5.7; little bit of dry mouth after snoring  Depression screen [facility] 2/9 04/02/2018 10/10/2017 10/10/2017 03/30/2017 09/28/2016  Decreased Interest 0 0 0 0 0  Down, Depressed, Hopeless  0 1 1 0 0  PHQ - 2 Score 0 1 1 0 0  Altered sleeping 0 3 - - -  Tired, decreased energy 0 1 - - -  Change in appetite 0 0 - - -  Feeling bad or failure about yourself  0 0 - - -  Trouble concentrating 0 1 - - -  Moving slowly or fidgety/restless 0 0 - - -  Suicidal thoughts 0 0 - - -  PHQ-9 Score 0 6 - - -  Difficult doing work/chores Not difficult at all Somewhat difficult - - -  MD note: patient is not depressed  Fall Risk  04/02/2018 10/10/2017 03/30/2017 09/28/2016 07/13/2016  Falls in the past year? 1 No No No No  Number falls in past yr: 0 - - - -  Injury with Fall? 0 - - - -    Relevant past medical, surgical, family and social history reviewed Past Medical History:  Diagnosis Date  . Allergy   . Brain aneurysm   . GERD (gastroesophageal reflux disease)    Past Surgical History:  Procedure Laterality Date  . COLONOSCOPY WITH PROPOFOL N/A 03/23/2015   Procedure: COLONOSCOPY WITH PROPOFOL;  Surgeon: Manya Silvas, MD;  Location: [facility] ENDOSCOPY;  Service: Endoscopy;  Laterality: N/A;  . TONSILLECTOMY Bilateral   . TUBAL LIGATION     Family History  Problem Relation Age of Onset  . Anxiety disorder Mother   . Aneurysm Mother        subarachnoid hemorrhage  . Thyroid disease Mother   . Mood Disorder Father   . Coronary artery disease Father   . Heart attack Father   . Depression Daughter   . Heart attack Brother   . Thyroid disease Sister   . Cancer Maternal Aunt        breast  . Breast cancer Maternal Aunt 54   Social History   Tobacco Use  . Smoking status: Never Smoker  . Smokeless tobacco: Never Used  Substance Use Topics  . Alcohol use: No    Alcohol/week: 0.0 standard drinks  . Drug use: No     Office Visit from 04/02/2018 in [facility]  AUDIT-C Score  0      Interim medical history since last visit reviewed. Allergies and medications reviewed  Review of Systems Per HPI unless specifically indicated above     Objective:      BP 120/72   Pulse 86   Temp 98.1 F (36.7 C)   Ht 5' 2"  (1.575 m)   Wt 161 lb 1.6 oz (73.1 kg)   SpO2 98%   BMI 29.47 kg/m   Wt Readings from Last 3 Encounters:  04/02/18 161 lb 1.6 oz (73.1 kg)  02/07/18 160 lb (72.6 kg)  10/10/17 158 lb 11.2 oz (72 kg)    Physical Exam Vitals signs reviewed.  Constitutional:      General: She is not in acute distress.    Appearance: She is well-developed. She is not diaphoretic.     Comments: overweight  HENT:     Head: Normocephalic and atraumatic.  Eyes:     General: No scleral icterus. Neck:     Thyroid: No thyromegaly.  Cardiovascular:     Rate and Rhythm: Normal rate and regular rhythm.     Heart sounds: Normal heart sounds. No murmur.  Pulmonary:     Effort: Pulmonary effort is normal. No respiratory distress.     Breath sounds: Normal breath sounds. No wheezing.  Abdominal:     General: Bowel sounds are normal. There is no distension.     Palpations: Abdomen is soft.  Skin:    General: Skin is warm and dry.     Coloration: Skin is not pale.  Neurological:     Mental Status: She is alert.     Cranial Nerves: No dysarthria or facial asymmetry.     Motor: No tremor.  Psychiatric:        Behavior: Behavior normal.        Thought Content: Thought content normal.        Judgment: Judgment normal.       Assessment & Plan:   Problem List Items Addressed This Visit      Other   Encounter for medication monitoring   Relevant Orders   COMPLETE METABOLIC PANEL WITH GFR   CBC (Completed)   CMP14+EGFR (Completed)   Prediabetes    Encouraged weight loss, healthy eating      Overweight (BMI 25.0-29.9)    Sedentary job and snacking identified as problems; however, she retires next week and this should help; check thyroid function in case that is contributing to her weigh tgain  Hyperlipidemia LDL goal <100 (Chronic)    Encouraged helathy diet low in sat fats; monitor lipids      Relevant Orders   CMP14+EGFR  (Completed)   Lipid panel (Completed)    Other Visit Diagnoses    Snoring    -  Primary   do NOT drive if tired; refer to neurologist for evaluation, consideration of sleep study, r/o narcolepsy, severe OSA   Relevant Orders   Ambulatory referral to Neurology   Uncontrolled daytime somnolence       explained risk of causing an accident, she could die or kill someone if she drives and falls asleep behind the wheel; DO NOT DRIVE until cleared by neuro   Relevant Orders   Ambulatory referral to Neurology   CMP14+EGFR (Completed)   TSH (Completed)   VITAMIN D 25 Hydroxy (Vit-D Deficiency, Fractures) (Completed)   Leg cramps       Relevant Orders   CMP14+EGFR (Completed)   Magnesium (Completed)   Estrogen deficiency       Relevant Orders   DG Bone Density   CMP14+EGFR (Completed)   Screening for breast cancer       Relevant Orders   MM 3D SCREEN BREAST BILATERAL       Follow up plan: Return in about 2 weeks (around 04/16/2018) for follow-up visit with Dr. Sanda Klein.  An after-visit summary was printed and given to the patient at [facility].  Please see the patient instructions which may contain other information and recommendations beyond what is mentioned above in the assessment and plan.  No orders of the defined types were placed in this encounter.   Orders Placed This Encounter  Procedures  . DG Bone Density  . MM 3D SCREEN BREAST BILATERAL  . COMPLETE METABOLIC PANEL WITH GFR  . CBC  . CMP14+EGFR  . Lipid panel  . Magnesium  . TSH  . VITAMIN D 25 Hydroxy (Vit-D Deficiency, Fractures)  . Ambulatory referral to Neurology

## 2018-04-04 LAB — MAGNESIUM: Magnesium: 2.2 mg/dL (ref 1.6–2.3)

## 2018-04-04 LAB — LIPID PANEL
Chol/HDL Ratio: 3 ratio (ref 0.0–4.4)
Cholesterol, Total: 187 mg/dL (ref 100–199)
HDL: 63 mg/dL (ref >39)
LDL Calculated: 101 mg/dL — ABNORMAL HIGH (ref 0–99)
TRIGLYCERIDES: 114 mg/dL (ref 0–149)
VLDL Cholesterol Cal: 23 mg/dL (ref 5–40)

## 2018-04-04 LAB — CMP14+EGFR
ALT: 17 IU/L (ref 0–32)
AST: 18 IU/L (ref 0–40)
Albumin/Globulin Ratio: 2.6 — ABNORMAL HIGH (ref 1.2–2.2)
Albumin: 4.6 g/dL (ref 3.8–4.8)
Alkaline Phosphatase: 92 IU/L (ref 39–117)
BUN/Creatinine Ratio: 26 (ref 12–28)
BUN: 18 mg/dL (ref 8–27)
Bilirubin Total: 0.2 mg/dL (ref 0.0–1.2)
CO2: 21 mmol/L (ref 20–29)
Calcium: 9.7 mg/dL (ref 8.7–10.3)
Chloride: 105 mmol/L (ref 96–106)
Creatinine, Ser: 0.69 mg/dL (ref 0.57–1.00)
GFR calc Af Amer: 106 mL/min/{1.73_m2} (ref >59)
GFR calc non Af Amer: 92 mL/min/{1.73_m2} (ref >59)
GLUCOSE: 93 mg/dL (ref 65–99)
Globulin, Total: 1.8 g/dL (ref 1.5–4.5)
Potassium: 4.9 mmol/L (ref 3.5–5.2)
Sodium: 143 mmol/L (ref 134–144)
Total Protein: 6.4 g/dL (ref 6.0–8.5)

## 2018-04-04 LAB — CBC
Hematocrit: 35.9 % (ref 34.0–46.6)
Hemoglobin: 12.3 g/dL (ref 11.1–15.9)
MCH: 32.1 pg (ref 26.6–33.0)
MCHC: 34.3 g/dL (ref 31.5–35.7)
MCV: 94 fL (ref 79–97)
Platelets: 263 10*3/uL (ref 150–450)
RBC: 3.83 x10E6/uL (ref 3.77–5.28)
RDW: 12.7 % (ref 11.7–15.4)
WBC: 5.1 10*3/uL (ref 3.4–10.8)

## 2018-04-04 LAB — TSH: TSH: 2.15 u[IU]/mL (ref 0.450–4.500)

## 2018-04-04 LAB — VITAMIN D 25 HYDROXY (VIT D DEFICIENCY, FRACTURES): Vit D, 25-Hydroxy: 31.7 ng/mL (ref 30.0–100.0)

## 2018-04-06 ENCOUNTER — Other Ambulatory Visit: Payer: Self-pay | Admitting: Family Medicine

## 2018-04-06 DIAGNOSIS — R7303 Prediabetes: Secondary | ICD-10-CM | POA: Insufficient documentation

## 2018-04-06 DIAGNOSIS — R771 Abnormality of globulin: Secondary | ICD-10-CM

## 2018-04-06 DIAGNOSIS — E663 Overweight: Secondary | ICD-10-CM | POA: Insufficient documentation

## 2018-04-06 NOTE — Assessment & Plan Note (Signed)
Encouraged helathy diet low in sat fats; monitor lipids

## 2018-04-06 NOTE — Assessment & Plan Note (Signed)
Encouraged weight loss, healthy eating

## 2018-04-06 NOTE — Progress Notes (Signed)
Order cmp, follow alb and globulin

## 2018-04-06 NOTE — Assessment & Plan Note (Signed)
Sedentary job and snacking identified as problems; however, she retires next week and this should help; check thyroid function in case that is contributing to her weigh tgain

## 2018-04-08 ENCOUNTER — Telehealth: Payer: Self-pay | Admitting: Family Medicine

## 2018-04-08 DIAGNOSIS — R928 Other abnormal and inconclusive findings on diagnostic imaging of breast: Secondary | ICD-10-CM

## 2018-04-08 NOTE — Telephone Encounter (Signed)
An order was placed for screening mammogram Patient actually needs a diagnostic test to f/u on abnormal imaging from Sept 2019 Ordered Please let her know she will NOT get the screening mammogram She needs diagnostic testing

## 2018-04-08 NOTE — Telephone Encounter (Signed)
-----   Message from Arnetha Courser, MD sent at 11/01/2017  2:26 PM EDT ----- Regarding: breast imaging Sept 30, 2019: RECOMMENDATION: Diagnostic right breast mammogram and ultrasound in 6 months.

## 2018-04-09 NOTE — Telephone Encounter (Signed)
Left detailed VM. CRM created.  

## 2018-04-10 ENCOUNTER — Ambulatory Visit: Payer: Managed Care, Other (non HMO) | Admitting: Family Medicine

## 2018-04-16 ENCOUNTER — Ambulatory Visit: Payer: Managed Care, Other (non HMO) | Admitting: Family Medicine

## 2018-04-19 ENCOUNTER — Encounter: Payer: Self-pay | Admitting: Family Medicine

## 2018-04-19 MED ORDER — BUPROPION HCL ER (SR) 100 MG PO TB12: ORAL_TABLET | ORAL | 0 refills | Status: DC

## 2018-04-19 NOTE — Telephone Encounter (Signed)
I tried calling neurologist's office; they are closed for lunch I tried calling patient, cell and work  #s, could not reach her Wrote her a MyChart note, will try back soon; I'm here to help

## 2018-04-19 NOTE — Telephone Encounter (Signed)
I spoke with patient She denies SI/HI We talked about the sleep study She will call Dr. Trena Platt office and I will to try to find out about getting her sleep study set up She is glad to make med change now, add wellbutrin; once on board, will consider tapering off the sertraline I'll let Dr. Manuella Ghazi know plan I told her to call with any issues or problems

## 2018-04-20 NOTE — Telephone Encounter (Signed)
I spoke with Laura Harper, asked about home sleep study; pending review, she'll check on status and send note downstairs to person who works on that I let her know about wellbutrin in his note, she wanted to go ahead and start rather than wait weeks for sleep study and results and starting CPAP if needed so I wrote for the wellbutrin; I hope that didn't step on any toes, and he is welcome to make changes to sertraline / wellbutrin too; I hope to taper the sertraline in a few weeks; we're a team and I welcome feedback or questions from him; he may call any time She will pass this along to him

## 2018-04-30 NOTE — Telephone Encounter (Signed)
Noted that sleep study has been scheduled; glad to hear

## 2018-05-01 ENCOUNTER — Ambulatory Visit (INDEPENDENT_AMBULATORY_CARE_PROVIDER_SITE_OTHER): Payer: Managed Care, Other (non HMO) | Admitting: Family Medicine

## 2018-05-01 ENCOUNTER — Encounter: Payer: Self-pay | Admitting: Family Medicine

## 2018-05-01 DIAGNOSIS — G2581 Restless legs syndrome: Secondary | ICD-10-CM | POA: Diagnosis not present

## 2018-05-01 DIAGNOSIS — E785 Hyperlipidemia, unspecified: Secondary | ICD-10-CM

## 2018-05-01 DIAGNOSIS — F330 Major depressive disorder, recurrent, mild: Secondary | ICD-10-CM | POA: Diagnosis not present

## 2018-05-01 MED ORDER — BUPROPION HCL ER (SR) 100 MG PO TB12: ORAL_TABLET | ORAL | 1 refills | Status: DC

## 2018-05-01 NOTE — Progress Notes (Signed)
There were no vitals taken for this visit.   Subjective:    Patient ID: Laura Harper, female    DOB: 01/02/1951, 67 y.o.   MRN: [id number]  HPI: Laura Harper is a 68 y.o. female  Chief Complaint  Patient presents with  . Follow-up    HPI Virtual Visit via Telephone Note   I connected with Laura Harper on May 01, 2018 at 8:43 am EDT by telephone and verified that I am speaking with the correct person using two identifiers.   Staff discussed the limitations, risks, security, and privacy concerns of performing an evaluation and management service by telephone and the availability of in-person appointments. Staff discussed with the patient that he/she may be responsible for charges related to this service. The patient expressed understanding and agreed to proceed. The patient was asked to check her surroundings, be sure to keep phone off of speaker, let me know immediately if someone comes near and our conversation has to be terminated or paused for privacy concerns.  Additional participants: none  Patient location: home Provider location: home  Call started: 8:43 am Call terminated: 8:53 Total length of call: 10 minutes   Nothing bothering her The wellbutrin and zoloft is working well; not anxious; the combination is working well; no trouble with sleep; doing well, no problems whatsoever She has her sleep study scheduled through the neurologist Not getting sleepy during the day any more; it is rare that she feels tired during the day She goes to bed around 11:00 am; puppy wakes her up around 6 am She needs a refill of the bupropion; she has plenty of the sertraline Legs are "crazy" again; before bedtime, restless legs; tonic water not helping; she has not tried stretching or yoga; outside and really active; maybe just tired; she is not keen on pills right now  High cholesterol; no problems with the statin; last lipid panel reviewed Lab Results  Component Value Date   CHOL  187 04/03/2018   HDL 63 04/03/2018   LDLCALC 101 (H) 04/03/2018   TRIG 114 04/03/2018   CHOLHDL 3.0 04/03/2018     Depression screen [facility] 2/9 05/01/2018 04/02/2018 10/10/2017 10/10/2017 03/30/2017  Decreased Interest 0 0 0 0 0  Down, Depressed, Hopeless 0 0 1 1 0  PHQ - 2 Score 0 0 1 1 0  Altered sleeping 0 0 3 - -  Tired, decreased energy 0 0 1 - -  Change in appetite 0 0 0 - -  Feeling bad or failure about yourself  0 0 0 - -  Trouble concentrating 0 0 1 - -  Moving slowly or fidgety/restless 0 0 0 - -  Suicidal thoughts 0 0 0 - -  PHQ-9 Score 0 0 6 - -  Difficult doing work/chores Not difficult at all Not difficult at all Somewhat difficult - -   Fall Risk  05/01/2018 04/02/2018 10/10/2017 03/30/2017 09/28/2016  Falls in the past year? 1 1 No No No  Number falls in past yr: 0 0 - - -  Injury with Fall? 1 0 - - -    Relevant past medical, surgical, family and social history reviewed Past Medical History:  Diagnosis Date  . Allergy   . Brain aneurysm   . GERD (gastroesophageal reflux disease)    Past Surgical History:  Procedure Laterality Date  . COLONOSCOPY WITH PROPOFOL N/A 03/23/2015   Procedure: COLONOSCOPY WITH PROPOFOL;  Surgeon: Manya Silvas, MD;  Location: [facility] ENDOSCOPY;  Service: Endoscopy;  Laterality: N/A;  . TONSILLECTOMY Bilateral   . TUBAL LIGATION     Family History  Problem Relation Age of Onset  . Anxiety disorder Mother   . Aneurysm Mother        subarachnoid hemorrhage  . Thyroid disease Mother   . Mood Disorder Father   . Coronary artery disease Father   . Heart attack Father   . Depression Daughter   . Heart attack Brother   . Thyroid disease Sister   . Cancer Maternal Aunt        breast  . Breast cancer Maternal Aunt 87   Social History   Tobacco Use  . Smoking status: Never Smoker  . Smokeless tobacco: Never Used  Substance Use Topics  . Alcohol use: No    Alcohol/week: 0.0 standard drinks  . Drug use: No     Office Visit from  05/01/2018 in [facility]  AUDIT-C Score  0      Interim medical history since last visit reviewed. Allergies and medications reviewed  Review of Systems Per HPI unless specifically indicated above     Objective:    There were no vitals taken for this visit.  Wt Readings from Last 3 Encounters:  04/02/18 161 lb 1.6 oz (73.1 kg)  02/07/18 160 lb (72.6 kg)  10/10/17 158 lb 11.2 oz (72 kg)    Physical Exam Pulmonary:     Effort: No respiratory distress.  Neurological:     Mental Status: She is alert.     Cranial Nerves: No dysarthria.  Psychiatric:        Speech: Speech is not rapid and pressured, delayed or slurred.     Results for orders placed or performed in visit on 04/02/18  CBC  Result Value Ref Range   WBC 5.1 3.4 - 10.8 x10E3/uL   RBC 3.83 3.77 - 5.28 x10E6/uL   Hemoglobin 12.3 11.1 - 15.9 g/dL   Hematocrit 35.9 34.0 - 46.6 %   MCV 94 79 - 97 fL   MCH 32.1 26.6 - 33.0 pg   MCHC 34.3 31.5 - 35.7 g/dL   RDW 12.7 11.7 - 15.4 %   Platelets 263 150 - 450 x10E3/uL  CMP14+EGFR  Result Value Ref Range   Glucose 93 65 - 99 mg/dL   BUN 18 8 - 27 mg/dL   Creatinine, Ser 0.69 0.57 - 1.00 mg/dL   GFR calc non Af Amer 92 >59 mL/min/1.73   GFR calc Af Amer 106 >59 mL/min/1.73   BUN/Creatinine Ratio 26 12 - 28   Sodium 143 134 - 144 mmol/L   Potassium 4.9 3.5 - 5.2 mmol/L   Chloride 105 96 - 106 mmol/L   CO2 21 20 - 29 mmol/L   Calcium 9.7 8.7 - 10.3 mg/dL   Total Protein 6.4 6.0 - 8.5 g/dL   Albumin 4.6 3.8 - 4.8 g/dL   Globulin, Total 1.8 1.5 - 4.5 g/dL   Albumin/Globulin Ratio 2.6 (H) 1.2 - 2.2   Bilirubin Total 0.2 0.0 - 1.2 mg/dL   Alkaline Phosphatase 92 39 - 117 IU/L   AST 18 0 - 40 IU/L   ALT 17 0 - 32 IU/L  Lipid panel  Result Value Ref Range   Cholesterol, Total 187 100 - 199 mg/dL   Triglycerides 114 0 - 149 mg/dL   HDL 63 >39 mg/dL   VLDL Cholesterol Cal 23 5 - 40 mg/dL   LDL Calculated 101 (H) 0 -  99 mg/dL   Chol/HDL Ratio  3.0 0.0 - 4.4 ratio  Magnesium  Result Value Ref Range   Magnesium 2.2 1.6 - 2.3 mg/dL  TSH  Result Value Ref Range   TSH 2.150 0.450 - 4.500 uIU/mL  VITAMIN D 25 Hydroxy (Vit-D Deficiency, Fractures)  Result Value Ref Range   Vit D, 25-Hydroxy 31.7 30.0 - 100.0 ng/mL      Assessment & Plan:   Problem List Items Addressed This Visit      Other   Major depressive disorder, recurrent episode ([place]) - Primary    Patient is doing much better with the combination of SSRI and wellbutrin; she wishes to continue; refills sent      Relevant Medications   buPROPion (WELLBUTRIN SR) 100 MG 12 hr tablet   Hyperlipidemia LDL goal <100 (Chronic)    She is so close to goal; continue statin; limit bacon, saturated fats; she is staying active       Other Visit Diagnoses    Restless legs       she'll try stretching, yoga; we could consider medicine, but we both agree to postpone adding another pill at this time       Follow up plan: Return in about 3 months (around 07/31/2018) for follow-up visit with Dr. Sanda Klein.  An after-visit summary was printed and given to the patient at [facility].  Please see the patient instructions which may contain other information and recommendations beyond what is mentioned above in the assessment and plan.  Meds ordered this encounter  Medications  . buPROPion (WELLBUTRIN SR) 100 MG 12 hr tablet    Sig: Take one tablet by mouth every morning and one pill every afternoon around 3 pm    Dispense:  180 tablet    Refill:  1    No orders of the defined types were placed in this encounter.

## 2018-05-01 NOTE — Assessment & Plan Note (Signed)
Patient is doing much better with the combination of SSRI and wellbutrin; she wishes to continue; refills sent

## 2018-05-01 NOTE — Assessment & Plan Note (Signed)
She is so close to goal; continue statin; limit bacon, saturated fats; she is staying active

## 2018-05-08 ENCOUNTER — Encounter: Payer: Self-pay | Admitting: Family Medicine

## 2018-05-09 NOTE — Telephone Encounter (Signed)
Will await request and fill when appropriate

## 2018-05-15 ENCOUNTER — Other Ambulatory Visit: Payer: Self-pay

## 2018-05-15 ENCOUNTER — Ambulatory Visit
Admission: RE | Admit: 2018-05-15 | Discharge: 2018-05-15 | Disposition: A | Discharge status: Home / Self Care | Payer: Medicare Other | Source: Ambulatory Visit | Attending: Family Medicine | Admitting: Family Medicine

## 2018-05-15 ENCOUNTER — Encounter: Payer: Self-pay | Admitting: Family Medicine

## 2018-05-15 DIAGNOSIS — M8580 Other specified disorders of bone density and structure, unspecified site: Secondary | ICD-10-CM

## 2018-05-15 DIAGNOSIS — E2839 Other primary ovarian failure: Secondary | ICD-10-CM

## 2018-05-15 DIAGNOSIS — R928 Other abnormal and inconclusive findings on diagnostic imaging of breast: Secondary | ICD-10-CM

## 2018-05-15 HISTORY — DX: Other specified disorders of bone density and structure, unspecified site: M85.80

## 2018-05-16 ENCOUNTER — Encounter: Payer: Self-pay | Admitting: Family Medicine

## 2018-05-16 ENCOUNTER — Other Ambulatory Visit: Payer: Self-pay | Admitting: Family Medicine

## 2018-05-16 DIAGNOSIS — Z1231 Encounter for screening mammogram for malignant neoplasm of breast: Secondary | ICD-10-CM

## 2018-05-16 DIAGNOSIS — N6310 Unspecified lump in the right breast, unspecified quadrant: Secondary | ICD-10-CM

## 2018-05-24 ENCOUNTER — Other Ambulatory Visit: Payer: Managed Care, Other (non HMO)

## 2018-06-29 DIAGNOSIS — G4733 Obstructive sleep apnea (adult) (pediatric): Secondary | ICD-10-CM | POA: Diagnosis not present

## 2018-07-04 ENCOUNTER — Encounter: Payer: Self-pay | Admitting: Family Medicine

## 2018-08-07 ENCOUNTER — Other Ambulatory Visit: Payer: Self-pay | Admitting: Neurology

## 2018-08-07 DIAGNOSIS — I729 Aneurysm of unspecified site: Secondary | ICD-10-CM

## 2018-08-09 ENCOUNTER — Other Ambulatory Visit: Payer: Self-pay | Admitting: *Deleted

## 2018-08-09 DIAGNOSIS — R6889 Other general symptoms and signs: Secondary | ICD-10-CM | POA: Diagnosis not present

## 2018-08-09 DIAGNOSIS — Z20822 Contact with and (suspected) exposure to covid-19: Secondary | ICD-10-CM

## 2018-08-15 LAB — NOVEL CORONAVIRUS, NAA: SARS-CoV-2, NAA: NOT DETECTED

## 2018-08-20 ENCOUNTER — Ambulatory Visit
Admission: RE | Admit: 2018-08-20 | Discharge: 2018-08-20 | Disposition: A | Discharge status: Home / Self Care | Payer: Medicare HMO | Source: Ambulatory Visit | Attending: Neurology | Admitting: Neurology

## 2018-08-20 ENCOUNTER — Other Ambulatory Visit: Payer: Self-pay

## 2018-08-20 DIAGNOSIS — I720 Aneurysm of carotid artery: Secondary | ICD-10-CM | POA: Diagnosis not present

## 2018-08-20 DIAGNOSIS — I729 Aneurysm of unspecified site: Secondary | ICD-10-CM | POA: Diagnosis present

## 2018-08-20 DIAGNOSIS — I671 Cerebral aneurysm, nonruptured: Secondary | ICD-10-CM | POA: Insufficient documentation

## 2018-08-21 ENCOUNTER — Encounter: Payer: Self-pay | Admitting: Family Medicine

## 2018-08-22 ENCOUNTER — Other Ambulatory Visit: Payer: Self-pay | Admitting: Nurse Practitioner

## 2018-08-22 MED ORDER — PRAVASTATIN SODIUM 40 MG PO TABS: 40.0000 mg | ORAL_TABLET | Freq: Every day | ORAL | 0 refills | Status: AC

## 2018-08-27 DIAGNOSIS — M79605 Pain in left leg: Secondary | ICD-10-CM | POA: Diagnosis not present

## 2018-08-27 DIAGNOSIS — M25552 Pain in left hip: Secondary | ICD-10-CM | POA: Diagnosis not present

## 2018-08-27 DIAGNOSIS — I729 Aneurysm of unspecified site: Secondary | ICD-10-CM | POA: Diagnosis not present

## 2018-08-27 DIAGNOSIS — G4710 Hypersomnia, unspecified: Secondary | ICD-10-CM | POA: Diagnosis not present

## 2018-08-27 DIAGNOSIS — H93A9 Pulsatile tinnitus, unspecified ear: Secondary | ICD-10-CM | POA: Diagnosis not present

## 2018-08-27 DIAGNOSIS — R0683 Snoring: Secondary | ICD-10-CM | POA: Diagnosis not present

## 2018-10-18 DIAGNOSIS — Z23 Encounter for immunization: Secondary | ICD-10-CM | POA: Diagnosis not present

## 2018-10-18 DIAGNOSIS — Z0000 Encounter for general adult medical examination without abnormal findings: Secondary | ICD-10-CM | POA: Diagnosis not present

## 2018-10-22 DIAGNOSIS — M79605 Pain in left leg: Secondary | ICD-10-CM | POA: Diagnosis not present

## 2018-10-22 DIAGNOSIS — Z79899 Other long term (current) drug therapy: Secondary | ICD-10-CM | POA: Diagnosis not present

## 2018-11-02 ENCOUNTER — Telehealth: Payer: Self-pay

## 2018-11-02 MED ORDER — BUPROPION HCL ER (SR) 100 MG PO TB12: ORAL_TABLET | ORAL | 0 refills | Status: AC

## 2018-11-02 NOTE — Telephone Encounter (Signed)
30 Day supply sent to [facility] on [address] - cannot refill [facility] without a [facility].

## 2018-11-06 ENCOUNTER — Ambulatory Visit: Payer: Medicare HMO | Admitting: Podiatry

## 2018-11-06 ENCOUNTER — Ambulatory Visit (INDEPENDENT_AMBULATORY_CARE_PROVIDER_SITE_OTHER): Payer: Medicare HMO

## 2018-11-06 ENCOUNTER — Other Ambulatory Visit: Payer: Self-pay

## 2018-11-06 ENCOUNTER — Encounter: Payer: Self-pay | Admitting: Podiatry

## 2018-11-06 ENCOUNTER — Other Ambulatory Visit: Payer: Self-pay | Admitting: Podiatry

## 2018-11-06 DIAGNOSIS — M76822 Posterior tibial tendinitis, left leg: Secondary | ICD-10-CM

## 2018-11-06 DIAGNOSIS — M25572 Pain in left ankle and joints of left foot: Secondary | ICD-10-CM

## 2018-11-06 DIAGNOSIS — M7752 Other enthesopathy of left foot: Secondary | ICD-10-CM

## 2018-11-06 MED ORDER — MELOXICAM 15 MG PO TABS: 15.0000 mg | ORAL_TABLET | Freq: Every day | ORAL | 1 refills | Status: DC

## 2018-11-08 DIAGNOSIS — Z23 Encounter for immunization: Secondary | ICD-10-CM | POA: Diagnosis not present

## 2018-11-08 DIAGNOSIS — Z0000 Encounter for general adult medical examination without abnormal findings: Secondary | ICD-10-CM | POA: Diagnosis not present

## 2018-11-08 DIAGNOSIS — Z124 Encounter for screening for malignant neoplasm of cervix: Secondary | ICD-10-CM | POA: Diagnosis not present

## 2018-11-08 DIAGNOSIS — Z1211 Encounter for screening for malignant neoplasm of colon: Secondary | ICD-10-CM | POA: Diagnosis not present

## 2018-11-08 DIAGNOSIS — Z01419 Encounter for gynecological examination (general) (routine) without abnormal findings: Secondary | ICD-10-CM | POA: Diagnosis not present

## 2018-11-08 DIAGNOSIS — Z1151 Encounter for screening for human papillomavirus (HPV): Secondary | ICD-10-CM | POA: Diagnosis not present

## 2018-11-09 NOTE — Progress Notes (Signed)
    HPI: 68 y.o. female presenting today as a new patient with a chief complaint of aching, burning pain of the medial left ankle and arch that began about 4 months ago. She reports associated stiffness in the morning. Walking increases the pain. She has been using BioFreeze, compression socks, an ankle brace and taking Tylenol and Advil for treatment. Patient is here for further evaluation and treatment.   Past Medical History:  Diagnosis Date  . Allergy   . Brain aneurysm   . GERD (gastroesophageal reflux disease)   . Osteopenia 05/15/2018       Physical Exam: General: The patient is alert and oriented x3 in no acute distress.  Dermatology: Skin is warm, dry and supple bilateral lower extremities. Negative for open lesions or macerations.  Vascular: Palpable pedal pulses bilaterally. No edema or erythema noted. Capillary refill within normal limits.  Neurological: Epicritic and protective threshold grossly intact bilaterally.   Musculoskeletal Exam: Pain on palpation noted to the posterior tibial tendon of the left foot. Pain on palpation and ROM of motion to the sinus tarsi of the left foot. Range of motion within normal limits. Muscle strength 5/5 in all muscle groups bilateral lower extremities.  Radiographic Exam:  Normal osseous mineralization. Joint spaces preserved. No fracture or dislocation identified.    Assessment: 1. Posterior tibial tendinitis left medial arch 2. Sinus tarsitis left   Plan of Care:  1. Patient was evaluated. Radiographs were reviewed today. 2. Injection of 0.5 mL Celestone Soluspan injected into the posterior tibial tendon sheath.  3. Injection of 0.5 mLs Celestone Soluspan injected into the sinus tarsi of the left foot.  4. Prescription for Meloxicam provided to patient. 5. Ankle brace dispensed.  6. Return to clinic in 4 weeks.   Moving to [place] in 2 months.    Edrick Kins, DPM [facility]  Dr. Edrick Kins, [address]                                        [place], [place] [address]                Office [phone]  Fax [phone]

## 2018-11-15 NOTE — Telephone Encounter (Signed)
Pt said she has not ordered any of the wellbutrin that she has plenty. She will call back to schedule appt

## 2018-11-23 DIAGNOSIS — G2581 Restless legs syndrome: Secondary | ICD-10-CM | POA: Diagnosis not present

## 2018-11-23 DIAGNOSIS — M25552 Pain in left hip: Secondary | ICD-10-CM | POA: Diagnosis not present

## 2018-11-23 DIAGNOSIS — M79605 Pain in left leg: Secondary | ICD-10-CM | POA: Diagnosis not present

## 2018-12-01 ENCOUNTER — Other Ambulatory Visit: Payer: Self-pay | Admitting: Family Medicine

## 2018-12-01 NOTE — Telephone Encounter (Signed)
Requested medication (s) are due for refill today: yes  Requested medication (s) are on the active medication list: yes  Last refill:  11/02/2018  Future visit scheduled: no  Notes to clinic:  Review for refill Overdue for office visit    Requested Prescriptions  Pending Prescriptions Disp Refills   buPROPion (WELLBUTRIN SR) 100 MG 12 hr tablet [Pharmacy Med Name: BUPROPION SR 100MG  TABLETS (12 H)] 60 tablet 0    Sig: TAKE 1 TABLET BY MOUTH EVERY MORNING AND 1 TABLET EVERY AFTERNOON AROUND 3:00 PM     Psychiatry: Antidepressants - bupropion Failed - 12/01/2018 10:46 AM      Failed - Valid encounter within last 6 months    Recent Outpatient Visits          7 months ago Mild episode of recurrent major depressive disorder [facility])   [place], Satira Anis, MD   8 months ago Snoring   [facility] [place], Satira Anis, MD   1 year ago Grief reaction   [facility], Satira Anis, MD   1 year ago Aneurysm [facility])   [place], Satira Anis, MD   2 years ago Hyperlipidemia LDL goal <100   [facility], Satira Anis, MD             Passed - Last BP in normal range    BP Readings from Last 1 Encounters:  04/02/18 120/72         Passed - Completed PHQ-2 or PHQ-9 in the last 360 days.

## 2018-12-04 ENCOUNTER — Encounter: Payer: Self-pay | Admitting: Podiatry

## 2018-12-04 ENCOUNTER — Ambulatory Visit: Payer: Medicare HMO | Admitting: Podiatry

## 2018-12-04 ENCOUNTER — Other Ambulatory Visit: Payer: Self-pay

## 2018-12-04 DIAGNOSIS — M25572 Pain in left ankle and joints of left foot: Secondary | ICD-10-CM

## 2018-12-04 DIAGNOSIS — M659 Synovitis and tenosynovitis, unspecified: Secondary | ICD-10-CM

## 2018-12-04 DIAGNOSIS — M76822 Posterior tibial tendinitis, left leg: Secondary | ICD-10-CM | POA: Diagnosis not present

## 2018-12-04 MED ORDER — METHYLPREDNISOLONE 4 MG PO TBPK: ORAL_TABLET | ORAL | 0 refills | Status: AC

## 2018-12-07 NOTE — Progress Notes (Signed)
    HPI: 68 y.o. female presenting today for follow up evaluation of left foot pain. She states the pain is relatively the same. She has been taking the Meloxicam but states it has not helped and caused flushing of the skin. She has been using the ankle brace which helps alleviate the pain. Being on the foot increases the pain. Patient is here for further evaluation and treatment.   Past Medical History:  Diagnosis Date  . Allergy   . Brain aneurysm   . GERD (gastroesophageal reflux disease)   . Osteopenia 05/15/2018       Physical Exam: General: The patient is alert and oriented x3 in no acute distress.  Dermatology: Skin is warm, dry and supple bilateral lower extremities. Negative for open lesions or macerations.  Vascular: Palpable pedal pulses bilaterally. No edema or erythema noted. Capillary refill within normal limits.  Neurological: Epicritic and protective threshold grossly intact bilaterally.   Musculoskeletal Exam: Pain on palpation noted to the posterior tibial tendon of the left foot as well as to the anterior, lateral and medial aspect of the left ankle. Range of motion within normal limits. Muscle strength 5/5 in all muscle groups bilateral lower extremities.  Assessment: 1. Posterior tibial tendinitis left 2. Ankle synovitis left  3. Posterior tibial tendinitis left medial arch - resolved  4. Sinus tarsitis left - resolved    Plan of Care:  1. Patient was evaluated.  2. Injection of 0.5 mL Celestone Soluspan injected into the posterior tibial tendon sheath.  3. Injection of 0.5 mLs Celestone Soluspan injected into the left ankle joint.  4. Prescription for Medrol Dose Pak provided to patient. 5. Continue taking OTC Aleve.  6. Continue using ankle brace.  7. Return to clinic as needed.    Edrick Kins, DPM [facility]  Dr. Edrick Kins, [place]                                        [place], [place] [address]                 Office [phone]  Fax [phone]

## 2019-05-28 ENCOUNTER — Other Ambulatory Visit: Payer: Self-pay | Admitting: Obstetrics & Gynecology

## 2019-10-29 IMAGING — US US BREAST*R* LIMITED INC AXILLA
1 series · 8 of 8 positions shown · non-contrast
Comparison: Previous exam(s).

CLINICAL DATA: 66-year-old female recalled from screening mammogram
dated 10/25/2017 for a possible right breast mass.

EXAM:
DIGITAL DIAGNOSTIC RIGHT MAMMOGRAM WITH CAD AND TOMO
ULTRASOUND RIGHT BREAST

[Series 1: us breast*right* limited inc axilla · 0.06mm/px · 8 of 8 slices shown]
[im 1/8]
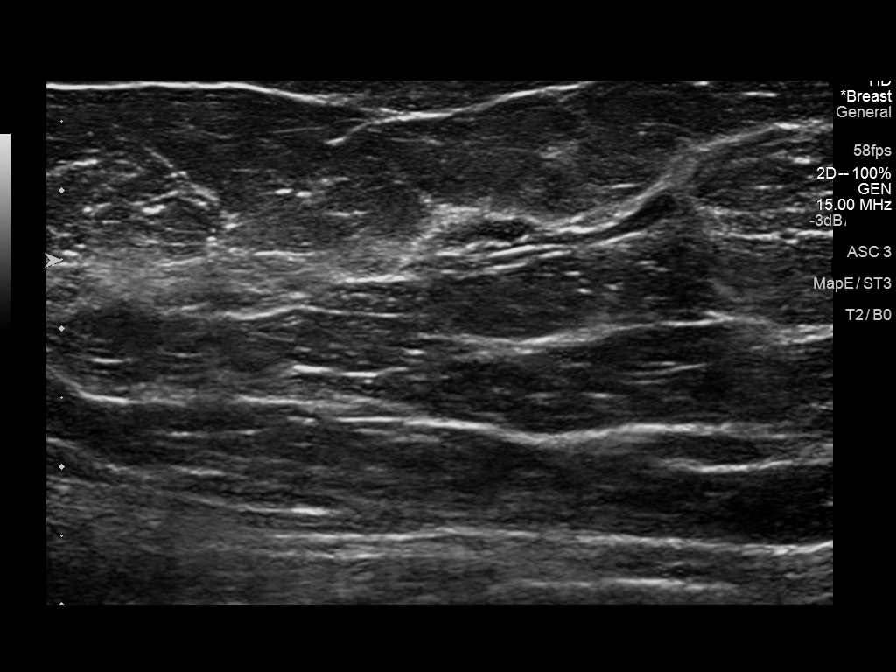
[im 2/8]
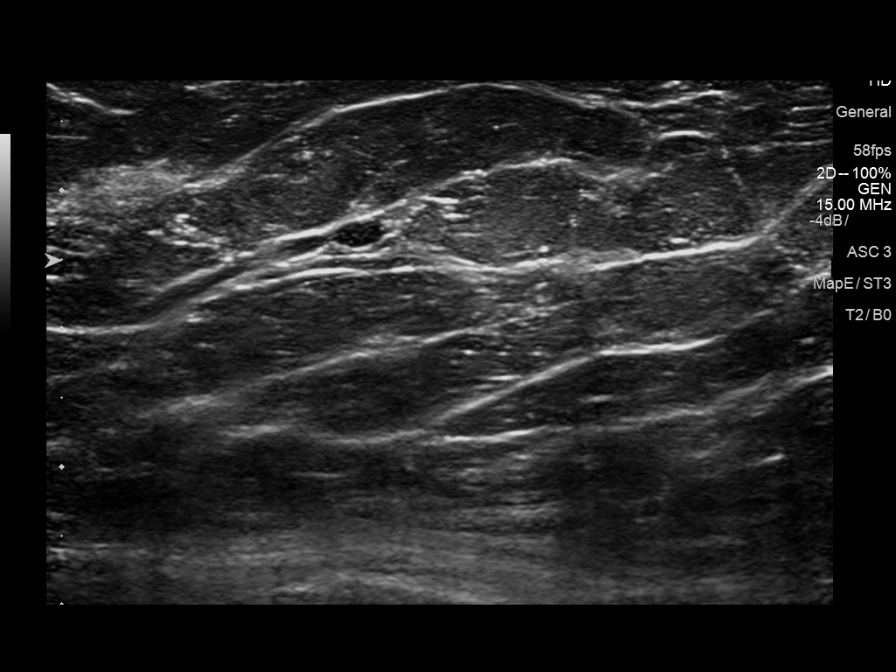
[im 3/8]
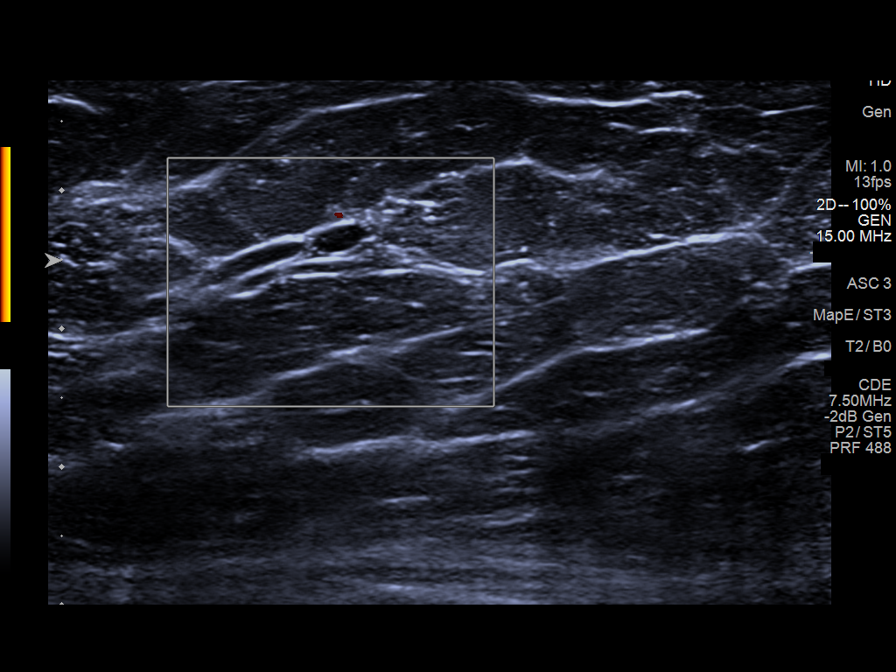
[im 4/8]
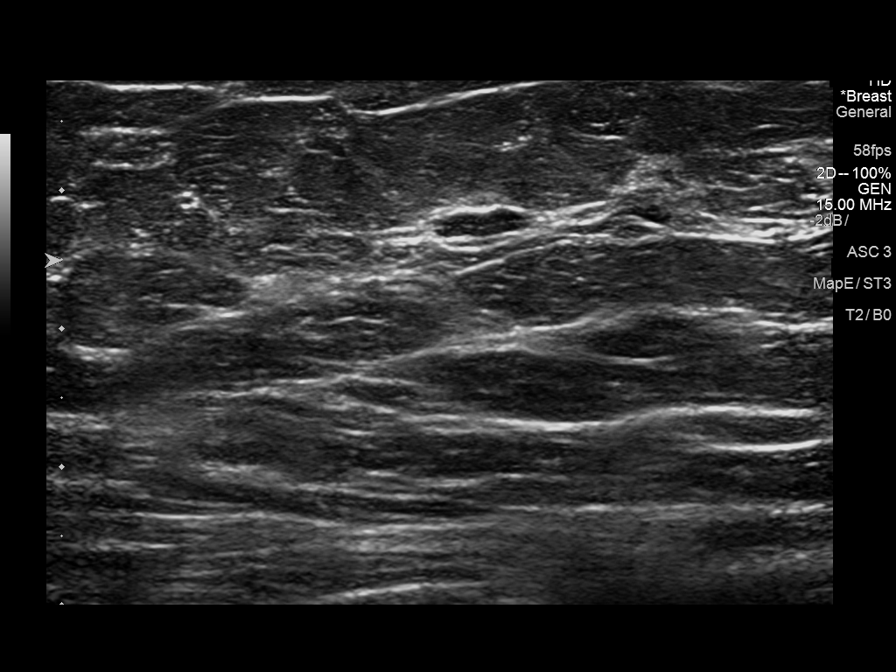
[im 5/8]
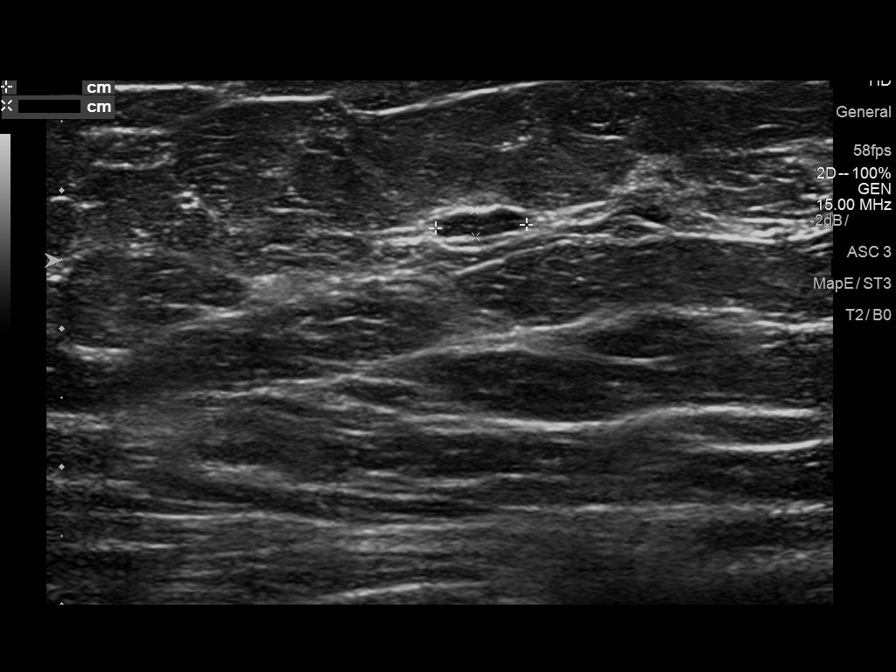
[im 6/8]
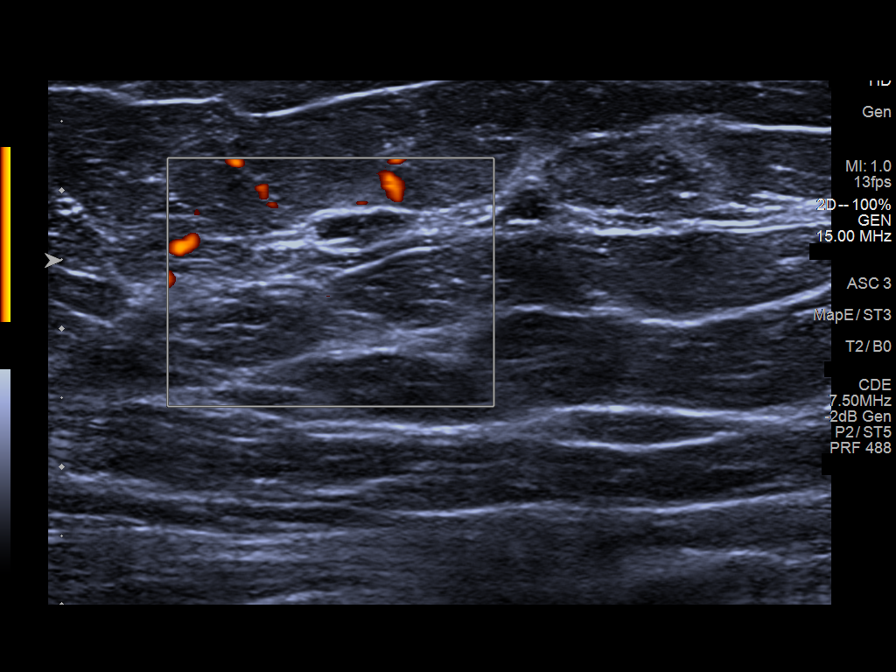
[im 7/8]
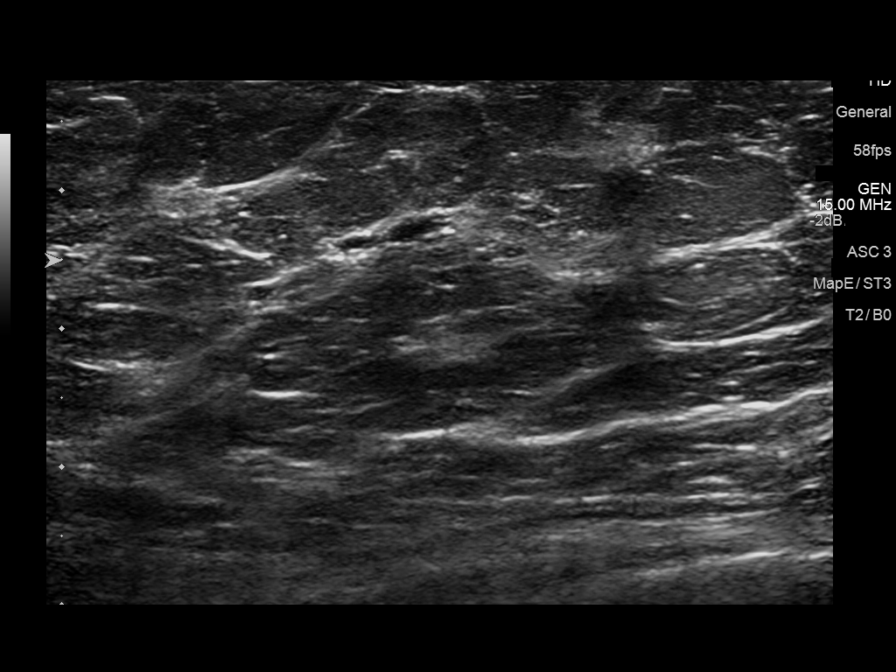
[im 8/8]
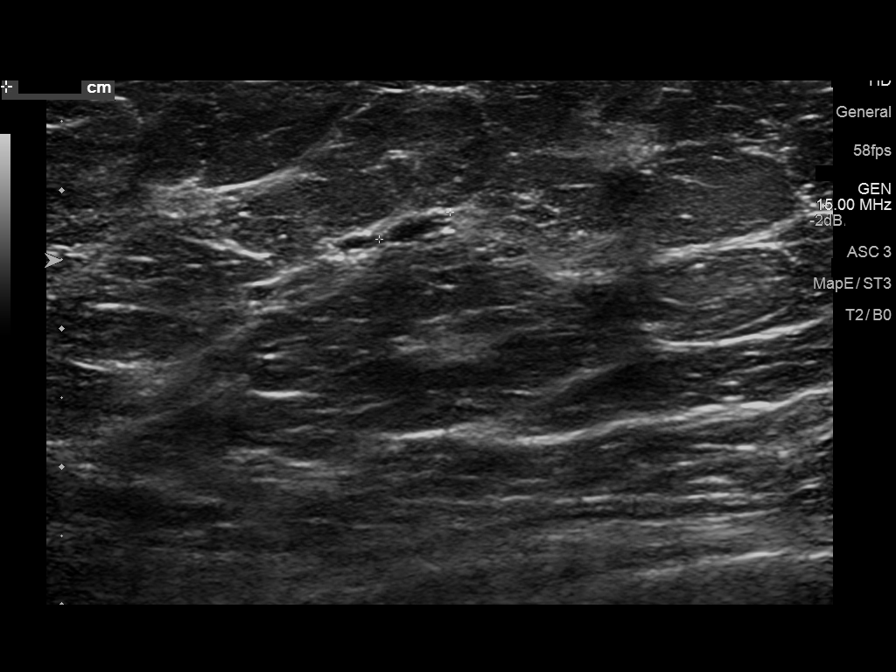

[8 of 8 positions shown; findings below may reference images not displayed]

ACR Breast Density Category b: There are scattered areas of
fibroglandular density.
FINDINGS: The long gated, circumscribed equal density mass persists in the
upper outer quadrant of the right breast at mid to posterior depth.
Further evaluation with ultrasound was performed.

Mammographic images were processed with CAD.

Targeted ultrasound is performed, showing multiple minimally dilated
ducts. A focal area of ductal dilatation is noted at the 10 o'clock
position 1 cm from the nipple. It measures 7 x 6 x 2 mm. This likely
correlates with the mammographic finding.
IMPRESSION: Probably benign findings in the right breast corresponding with the
screening mammographic finding. Recommendation is for short-term
mammographic and sonographic follow-up.

RECOMMENDATION:
Diagnostic right breast mammogram and ultrasound in 6 months.

I have discussed the findings and recommendations with the patient.
Results were also provided in writing at the conclusion of the
visit. If applicable, a reminder letter will be sent to the patient
regarding the next appointment.

BI-RADS CATEGORY  3: Probably benign.

## 2020-04-14 IMAGING — MG MM DIGITAL DIAGNOSTIC UNILAT*R* W/ TOMO W/ CAD
4 series · 4 of 12 positions shown · non-contrast
Comparison: Previous exam(s).

CLINICAL DATA: 66-year-old female recalled from screening mammogram
dated 10/25/2017 for a possible right breast mass.

EXAM:
DIGITAL DIAGNOSTIC RIGHT MAMMOGRAM WITH CAD AND TOMO
ULTRASOUND RIGHT BREAST

[R CC synth-2D]
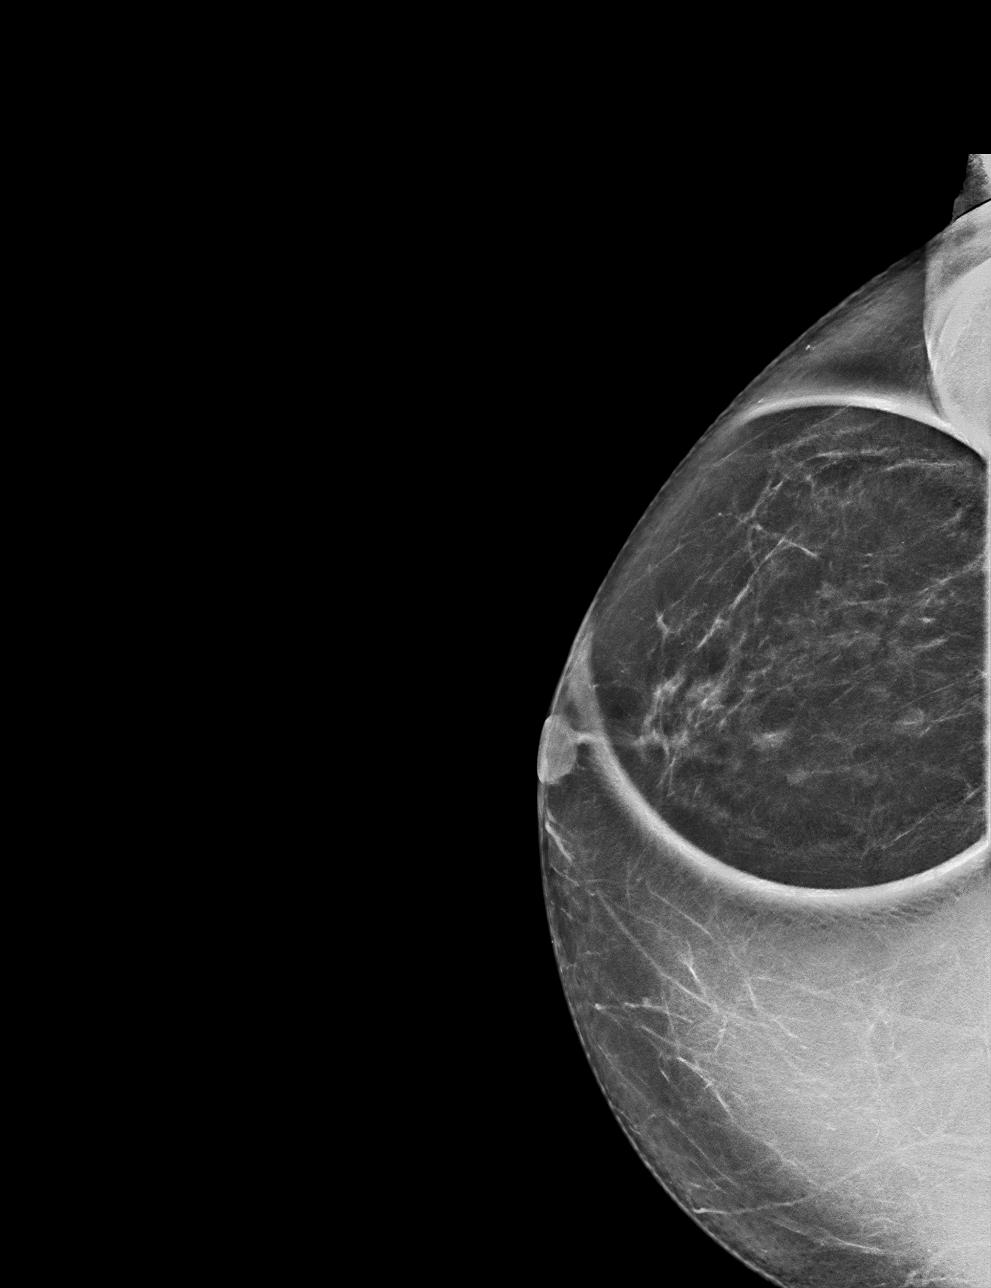

[R MLO synth-2D]
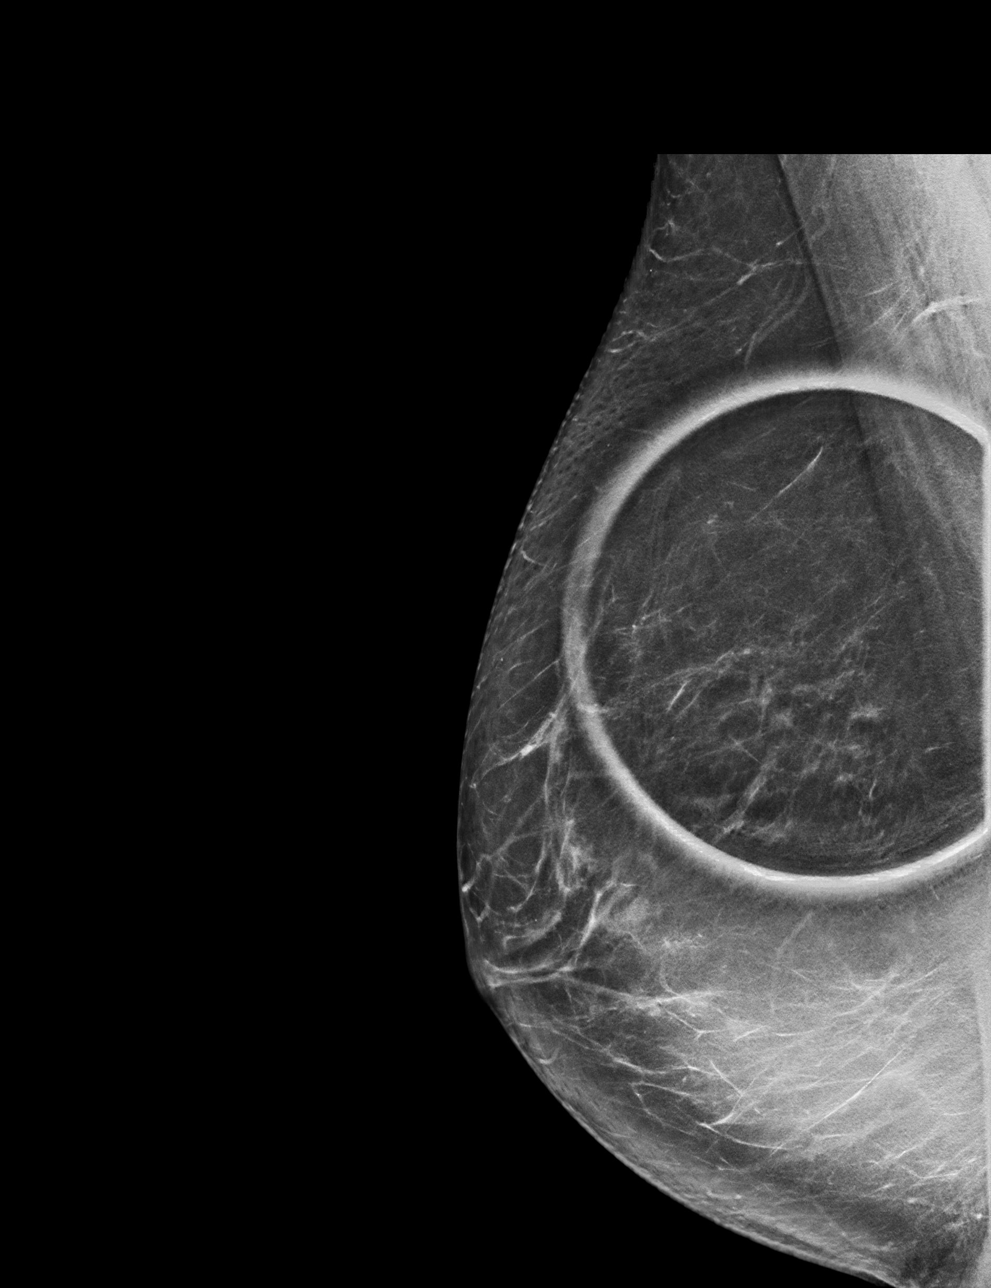

[R CC tomo · tomo slice 36/71.0]
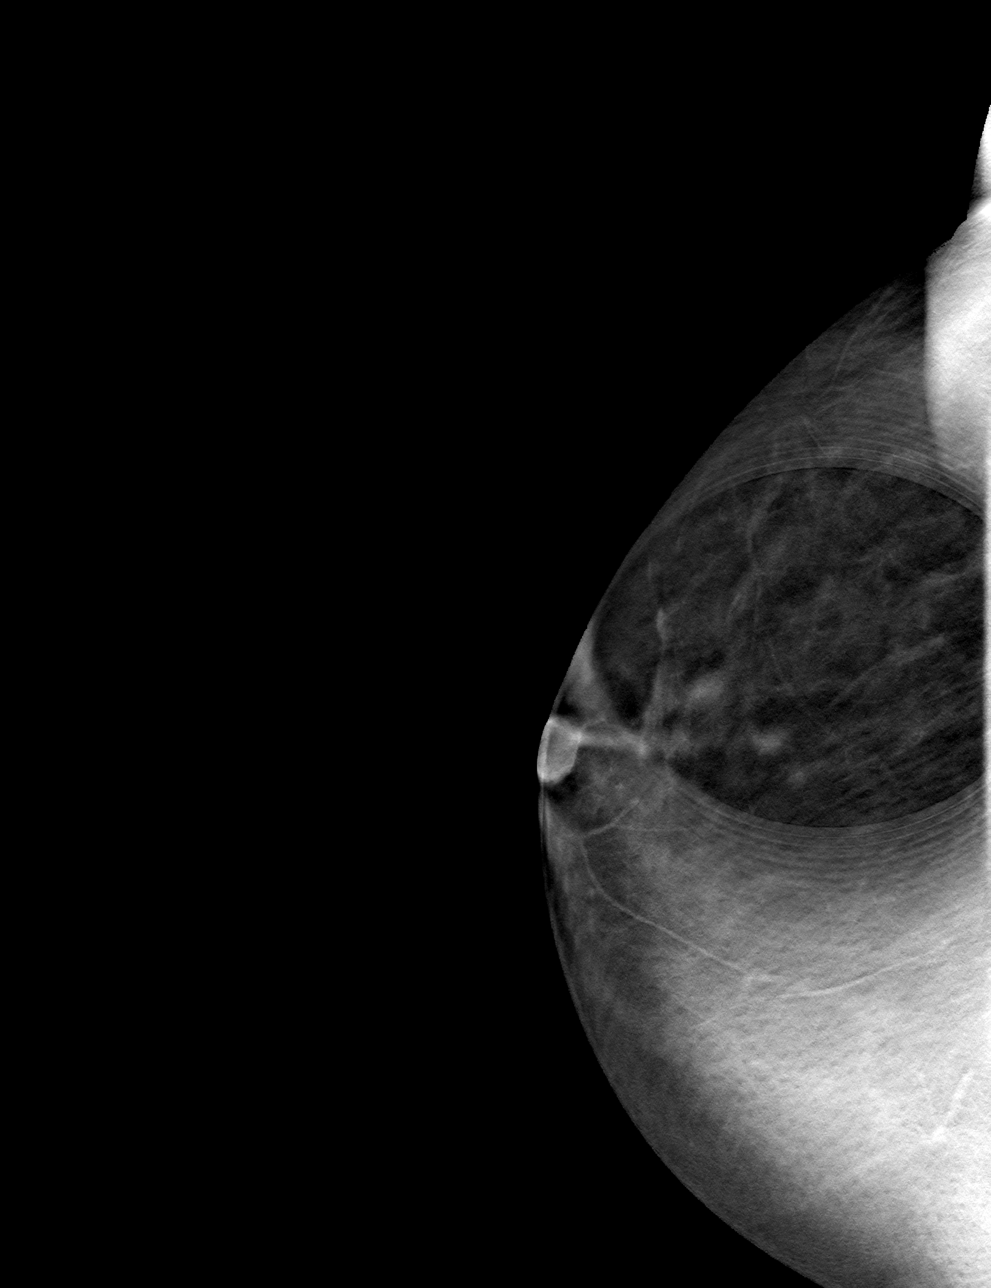

[R MLO tomo · tomo slice 38/75.0]
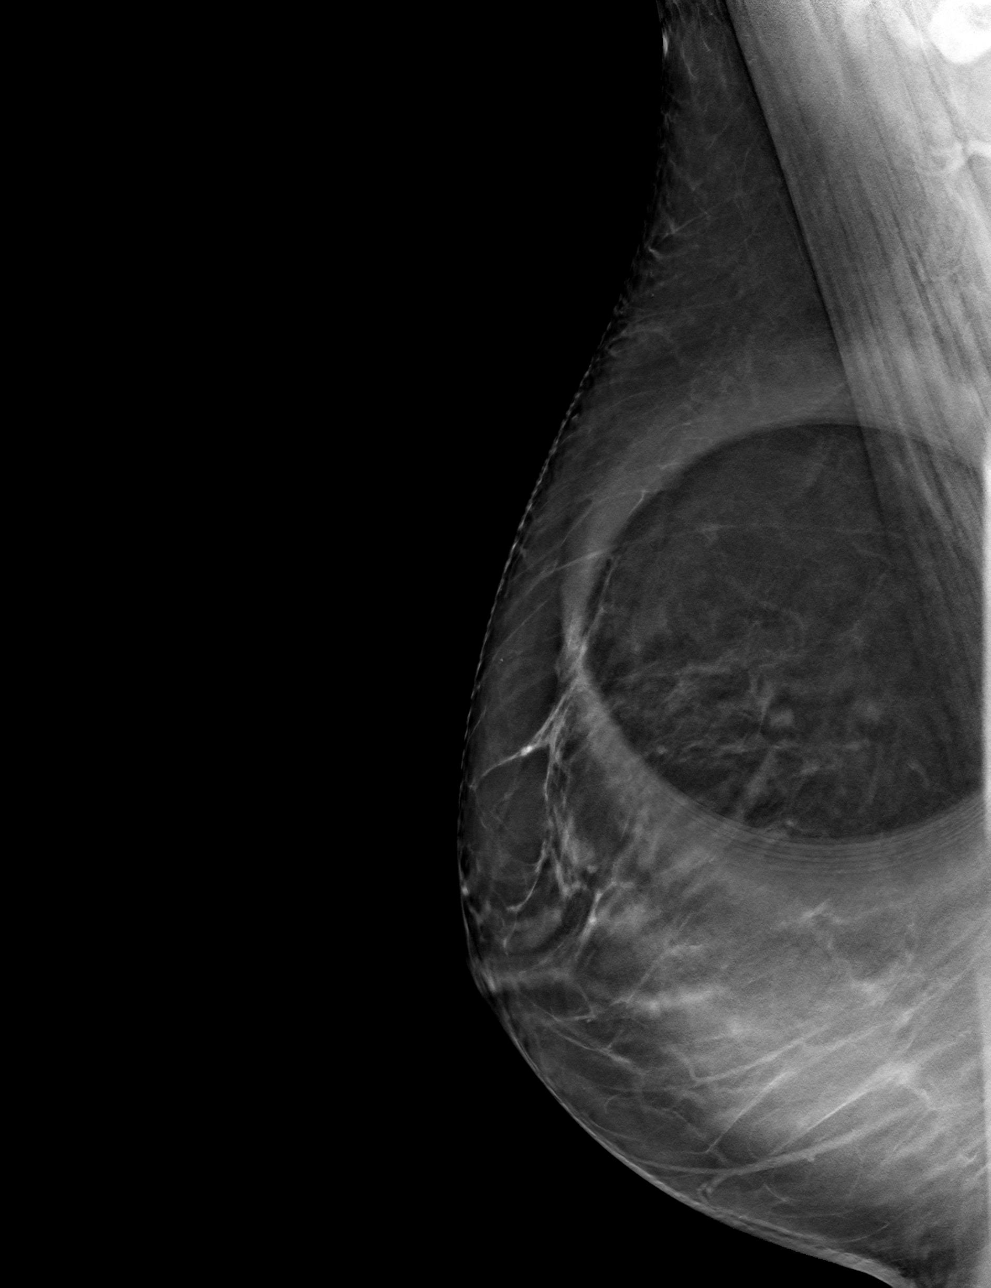

[4 of 12 positions shown; findings below may reference images not displayed]

ACR Breast Density Category b: There are scattered areas of
fibroglandular density.
FINDINGS: The long gated, circumscribed equal density mass persists in the
upper outer quadrant of the right breast at mid to posterior depth.
Further evaluation with ultrasound was performed.

Mammographic images were processed with CAD.

Targeted ultrasound is performed, showing multiple minimally dilated
ducts. A focal area of ductal dilatation is noted at the 10 o'clock
position 1 cm from the nipple. It measures 7 x 6 x 2 mm. This likely
correlates with the mammographic finding.
IMPRESSION: Probably benign findings in the right breast corresponding with the
screening mammographic finding. Recommendation is for short-term
mammographic and sonographic follow-up.

RECOMMENDATION:
Diagnostic right breast mammogram and ultrasound in 6 months.

I have discussed the findings and recommendations with the patient.
Results were also provided in writing at the conclusion of the
visit. If applicable, a reminder letter will be sent to the patient
regarding the next appointment.

BI-RADS CATEGORY  3: Probably benign.
